# Patient Record
Sex: Female | Born: 2000 | Race: Black or African American | Hispanic: No | Marital: Single | Smoking: Never smoker
Health system: Southern US, Community
[De-identification: ages and names within clinical notes are randomized; demographics above are authoritative.]

## PROBLEM LIST (undated history)

## (undated) DIAGNOSIS — M1990 Unspecified osteoarthritis, unspecified site: Secondary | ICD-10-CM

## (undated) DIAGNOSIS — D560 Alpha thalassemia: Secondary | ICD-10-CM

## (undated) DIAGNOSIS — L732 Hidradenitis suppurativa: Secondary | ICD-10-CM

## (undated) DIAGNOSIS — K5900 Constipation, unspecified: Secondary | ICD-10-CM

## (undated) DIAGNOSIS — R140 Abdominal distension (gaseous): Secondary | ICD-10-CM

## (undated) DIAGNOSIS — R109 Unspecified abdominal pain: Secondary | ICD-10-CM

## (undated) HISTORY — PX: HERNIA REPAIR: SHX51

## (undated) HISTORY — DX: Unspecified abdominal pain: R10.9

## (undated) HISTORY — DX: Abdominal distension (gaseous): R14.0

## (undated) HISTORY — PX: AXILLARY SURGERY: SHX892

## (undated) HISTORY — PX: WISDOM TOOTH EXTRACTION: SHX21

## (undated) HISTORY — DX: Unspecified osteoarthritis, unspecified site: M19.90

## (undated) HISTORY — DX: Constipation, unspecified: K59.00

---
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
## 2018-03-03 ENCOUNTER — Ambulatory Visit (HOSPITAL_COMMUNITY)
Admission: EM | Admit: 2018-03-03 | Discharge: 2018-03-03 | Disposition: A | Discharge status: Home / Self Care | Payer: BC Managed Care – PPO | Attending: Family Medicine | Admitting: Family Medicine

## 2018-03-03 ENCOUNTER — Encounter (HOSPITAL_COMMUNITY): Payer: Self-pay

## 2018-03-03 DIAGNOSIS — L0231 Cutaneous abscess of buttock: Secondary | ICD-10-CM | POA: Diagnosis not present

## 2018-03-03 MED ORDER — CEFDINIR 300 MG PO CAPS: 300.0000 mg | ORAL_CAPSULE | Freq: Two times a day (BID) | ORAL | 0 refills | Status: AC

## 2018-03-03 MED ORDER — MUPIROCIN 2 % EX OINT: 1.0000 "application " | TOPICAL_OINTMENT | Freq: Two times a day (BID) | CUTANEOUS | 0 refills | Status: DC

## 2018-03-03 NOTE — ED Provider Notes (Signed)
[facility]    CSN: [id number] Arrival date & time: 03/03/18  [id number]     History   Chief Complaint Chief Complaint  Patient presents with  . Abscess    right lower cheek area    HPI Rebekah Bennett is a 17 y.o. female.   17 year old female with history of hidradenitis comes in with mother for 6-day history of abscess to the right buttock.  States noticed mild swelling 6 days ago,  and has been applying ointment dermatologist provided, however area with increased swelling, and started hurting 3 days ago.  Mother states has had low-grade fever,  T-max 99.9.  Has been applying warm compress without relief.      Past Medical History:  Diagnosis Date  . Abdominal pain   . Constipation   . Postprandial bloating     Patient Active Problem List   Diagnosis Date Noted  . Microcytic anemia 11/01/2017  . Alpha thalassemia minor trait 11/01/2017  . Hidradenitis suppurativa 05/19/2016  . Intestinal bacterial overgrowth 11/08/2012  . Abdominal bloating 09/16/2012  . Chronic constipation   . Generalized abdominal pain     Past Surgical History:  Procedure Laterality Date  . HERNIA REPAIR      OB History   None      Home Medications    Prior to Admission medications   Medication Sig Start Date End Date Taking? Authorizing Provider  betamethasone dipropionate (DIPROLENE) 0.05 % ointment Apply topically. 03/03/17 03/03/18  [provider]  cefdinir (OMNICEF) 300 MG capsule Take 1 capsule (300 mg total) by mouth 2 (two) times daily for 7 days. 03/03/18 03/10/18  Belinda FisherYu, Harveen Flesch V, PA-C  dicyclomine (BENTYL) 10 MG capsule Take 2 capsules (20 mg total) by mouth 4 (four) times daily -  before meals and at bedtime. 09/23/14 09/25/14  Truddie CocoBush, Tamika, DO  ibuprofen (ADVIL,MOTRIN) 200 MG tablet Take 200 mg by mouth every 6 (six) hours as needed.    [provider]  lansoprazole (PREVACID SOLUTAB) 15 MG disintegrating tablet Take 1 tablet (15 mg total) by mouth 2 (two)  times daily before a meal. 09/23/14 10/23/14  Truddie CocoBush, Tamika, DO  lidocaine (XYLOCAINE) 5 % ointment Apply every 3 hours as needed for pain 12/02/16   [provider]  mupirocin ointment (BACTROBAN) 2 % Apply 1 application topically 2 (two) times daily. 03/03/18   Lygia Olaes V, PA-C  Norethin Ace-Eth Estrad-FE (TAYTULLA PO) Take by mouth.    [provider]  polyethylene glycol powder (GLYCOLAX/MIRALAX) powder Take 17 g by mouth daily.    [provider]  TAYTULLA 1-20 MG-MCG(24) CAPS  10/29/17   [provider]    Family History Family History  Adopted: Yes  Problem Relation Age of Onset  . Healthy Mother     Social History Social History   Tobacco Use  . Smoking status: Never Smoker  . Smokeless tobacco: Never Used  Substance Use Topics  . Alcohol use: Not on file  . Drug use: Not on file     Allergies   Patient has no known allergies.   Review of Systems Review of Systems  Reason unable to perform ROS: See HPI as above.     Physical Exam Triage Vital Signs ED Triage Vitals  Enc Vitals Group     BP 03/03/18 1015 (!) 128/59     Pulse Rate 03/03/18 1015 72     Resp 03/03/18 1015 20     Temp 03/03/18 1015 98.9 F (37.2  C)     Temp Source 03/03/18 1015 Oral     SpO2 03/03/18 1015 100 %     Weight --      Height --      Head Circumference --      Peak Flow --      Pain Score 03/03/18 1013 10     Pain Loc --      Pain Edu? --      Excl. in GC? --    No data found.  Updated Vital Signs BP (!) 128/59 (BP Location: Left Arm)   Pulse 72   Temp 98.9 F (37.2 C) (Oral)   Resp 20   LMP 02/08/2018   SpO2 100%   Physical Exam  Constitutional: She is oriented to person, place, and time. She appears well-developed and well-nourished. No distress.  HENT:  Head: Normocephalic and atraumatic.  Eyes: Pupils are equal, round, and reactive to light. Conjunctivae are normal.  Genitourinary:     Genitourinary Comments: No tenderness to  palpation of the perirectal area.  No tenderness to palpation of the rectum.  Neurological: She is alert and oriented to person, place, and time.  Skin: She is not diaphoretic.     UC Treatments / Results  Labs (all labs ordered are listed, but only abnormal results are displayed) Labs Reviewed - No data to display  EKG None  Radiology No results found.  Procedures Incision and Drainage Date/Time: 03/03/2018 11:29 AM Performed by: Belinda Fisher, PA-C Authorized by: Mardella Layman, MD   Consent:    Consent obtained:  Verbal   Consent given by:  Parent and patient   Risks discussed:  Bleeding, incomplete drainage, infection and pain   Alternatives discussed:  Referral Location:    Type:  Abscess   Size:  2cm x 3cm   Location:  Lower extremity   Lower extremity location:  Buttock   Buttock location:  R buttock Pre-procedure details:    Skin preparation:  Betadine Anesthesia (see MAR for exact dosages):    Anesthesia method:  Local infiltration   Local anesthetic:  Lidocaine 2% WITH epi Procedure type:    Complexity:  Simple Procedure details:    Incision types:  Single straight   Scalpel blade:  11   Wound management:  Probed and deloculated and irrigated with saline   Drainage:  Purulent   Drainage amount:  Copious   Wound treatment:  Wound left open   Packing materials:  None Post-procedure details:    Patient tolerance of procedure:  Tolerated well, no immediate complications   (including critical care time)  Medications Ordered in UC Medications - No data to display  Initial Impression / Assessment and Plan / UC Course  I have reviewed the triage vital signs and the nursing notes.  Pertinent labs & imaging results that were available during my care of the patient were reviewed by me and considered in my medical decision making (see chart for details).    Patient tolerated procedure well.  Wound care instructions discussed.  Will have patient start Cefdinir as  directed.  Return precautions given.  Otherwise follow-up with dermatology for further evaluation and management needed.  Final Clinical Impressions(s) / UC Diagnoses   Final diagnoses:  Abscess of buttock, right    ED Prescriptions    Medication Sig Dispense Auth. Provider   cefdinir (OMNICEF) 300 MG capsule Take 1 capsule (300 mg total) by mouth 2 (two) times daily for 7 days. 14 capsule Cathie Hoops,  Merrilyn Legler V, PA-C   mupirocin ointment (BACTROBAN) 2 % Apply 1 application topically 2 (two) times daily. 22 g Threasa Alpha, [place] 03/03/18 1131

## 2018-03-03 NOTE — Discharge Instructions (Addendum)
Abscess drained.  As discussed, other areas was not big enough for drainage. Start cefdinir as directed. This will treat the surrounding skin infection, and can help prevent the other areas from needing draining.  Continue warm compress.  He can use Bactroban ointment as barrier when redressing the wound.  You can apply gauze to the butt cheeks to help prevent friction to the area.  Please update dermatologist on recent procedure.  Follow-up with dermatologist for further evaluation needed.  If experiencing worsening symptoms, worsening swelling, redness, increased warmth follow-up for reevaluation needed.

## 2018-03-03 NOTE — ED Triage Notes (Signed)
Pt presents with abcess on right lower cheek area.

## 2018-04-07 ENCOUNTER — Ambulatory Visit (HOSPITAL_COMMUNITY)
Admission: EM | Admit: 2018-04-07 | Discharge: 2018-04-07 | Disposition: A | Discharge status: Home / Self Care | Payer: BC Managed Care – PPO | Attending: Family Medicine | Admitting: Family Medicine

## 2018-04-07 ENCOUNTER — Encounter (HOSPITAL_COMMUNITY): Payer: Self-pay

## 2018-04-07 ENCOUNTER — Other Ambulatory Visit: Payer: Self-pay

## 2018-04-07 DIAGNOSIS — J039 Acute tonsillitis, unspecified: Secondary | ICD-10-CM | POA: Insufficient documentation

## 2018-04-07 DIAGNOSIS — J029 Acute pharyngitis, unspecified: Secondary | ICD-10-CM | POA: Diagnosis present

## 2018-04-07 DIAGNOSIS — Z7984 Long term (current) use of oral hypoglycemic drugs: Secondary | ICD-10-CM | POA: Insufficient documentation

## 2018-04-07 DIAGNOSIS — R499 Unspecified voice and resonance disorder: Secondary | ICD-10-CM | POA: Diagnosis not present

## 2018-04-07 DIAGNOSIS — Z79899 Other long term (current) drug therapy: Secondary | ICD-10-CM | POA: Insufficient documentation

## 2018-04-07 LAB — POCT RAPID STREP A: Streptococcus, Group A Screen (Direct): NEGATIVE

## 2018-04-07 MED ORDER — AMOXICILLIN-POT CLAVULANATE 875-125 MG PO TABS: 1.0000 | ORAL_TABLET | Freq: Two times a day (BID) | ORAL | 0 refills | Status: DC

## 2018-04-07 MED ORDER — LIDOCAINE VISCOUS HCL 2 % MT SOLN: 15.0000 mL | OROMUCOSAL | 0 refills | Status: DC | PRN

## 2018-04-07 NOTE — Discharge Instructions (Addendum)
Rest and push fluids Strep was negative Concern for early onset peritonsillar abscess.  Augmentin prescribed.  Take as directed and to completion Continue to alternate ibuprofen and tylenol as needed for symptomatic relief Return or go to the ED if you have any new or worsening symptoms including including increased pain, fever, difficulty swallowing liquids or saliva, trouble breathing, chest pain, vomiting, abdominal pain, etc..

## 2018-04-07 NOTE — ED Provider Notes (Signed)
[facility]   [id number] 04/07/18 Arrival Time: 0800   CC: Sore throat   SUBJECTIVE: History from: patient.  Rebekah Bennett is a 17 y.o. female who presents with abrupt onset of sore throat that started last night.  Denies positive sick exposure or precipitating event.  Has tried tyenol without relief.  Symptoms are made worse with swallowing, but is tolerating liquids and own secretions without difficulty.  Denies previous symptoms in the past.   Reports subjective fever, chills, and muffled voice.  Denies fatigue, sinus pain, rhinorrhea, cough, SOB, wheezing, drooling, chest pain, nausea, rash, changes in bowel or bladder habits.    ROS: As per HPI.  Past Medical History:  Diagnosis Date  . Abdominal pain   . Constipation   . Postprandial bloating    Past Surgical History:  Procedure Laterality Date  . HERNIA REPAIR     No Known Allergies No current facility-administered medications on file prior to encounter.    Current Outpatient Medications on File Prior to Encounter  Medication Sig Dispense Refill  . metFORMIN (GLUCOPHAGE) 500 MG tablet Take by mouth 2 (two) times daily with a meal.    . ibuprofen (ADVIL,MOTRIN) 200 MG tablet Take 200 mg by mouth every 6 (six) hours as needed.    . Norethin Ace-Eth Estrad-FE (Rebekah PO) Take by mouth.    . polyethylene glycol powder (GLYCOLAX/MIRALAX) powder Take 17 g by mouth daily.    Rebekah Bennett. Rebekah Bennett      Social History   Socioeconomic History  . Marital status: Single    Spouse name: Not on file  . Number of children: Not on file  . Years of education: Not on file  . Highest education level: Not on file  Occupational History  . Not on file  Social Needs  . Financial resource strain: Not on file  . Food insecurity:    Worry: Not on file    Inability: Not on file  . Transportation needs:    Medical: Not on file    Non-medical: Not on file  Tobacco Use  . Smoking status: Never Smoker  .  Smokeless tobacco: Never Used  Substance and Sexual Activity  . Alcohol use: Not on file  . Drug use: Not on file  . Sexual activity: Not on file  Lifestyle  . Physical activity:    Days per week: Not on file    Minutes per session: Not on file  . Stress: Not on file  Relationships  . Social connections:    Talks on phone: Not on file    Gets together: Not on file    Attends religious service: Not on file    Active member of club or organization: Not on file    Attends meetings of clubs or organizations: Not on file    Relationship status: Not on file  . Intimate partner violence:    Fear of current or ex partner: Not on file    Emotionally abused: Not on file    Physically abused: Not on file    Forced sexual activity: Not on file  Other Topics Concern  . Not on file  Social History Narrative   8th grade   Family History  Adopted: Yes  Problem Relation Age of Onset  . Healthy Mother     OBJECTIVE:  Vitals:   04/07/18 0811 04/07/18 0815  BP: 132/76   Pulse: 91   Resp: 18   Temp: 99.1 F (37.3 C)  TempSrc: Oral   SpO2: 98%   Weight:  259 lb (117.5 kg)     General appearance: alert; appears fatigued; nontoxic appearance HEENT: Ears: EACs clear, TMs pearly gray with visible cone of light, without erythema; Eyes: PERRL, EOMI grossly; Sinuses nontender to palpation; Nose: clear rhinorrhea; Throat: oropharynx mildly erythematous, tonsils 3+ with white tonsillar exudates, uvula midline, no obvious soft palate swelling or erythema Neck: right sided submandibular and anterior cervical chain LAD Lungs: CTA bilaterally without adventitious breath sounds Heart: regular rate and rhythm.  Radial pulses 2+ symmetrical bilaterally Skin: warm and dry Psychological: alert and cooperative; normal mood and affect  Labs: Results for orders placed or performed during the hospital encounter of 04/07/18 (from the past 24 hour(s))  POCT rapid strep A [facility]     Status:  None   Collection Time: 04/07/18  8:45 AM  Result Value Ref Range   Streptococcus, Group A Screen (Direct) NEGATIVE NEGATIVE     ASSESSMENT & PLAN:  1. Acute tonsillitis, unspecified etiology   2. Change in voice   3. Sore throat     Meds ordered this encounter  Medications  . amoxicillin-clavulanate (AUGMENTIN) 875-125 MG tablet    Sig: Take 1 tablet by mouth every 12 (twelve) hours.    Dispense:  10 tablet    Refill:  0    Order Specific Question:   Supervising Provider    Answer:   Rebekah Bennett [phone][[id number]]  . lidocaine (XYLOCAINE) 2 % solution    Sig: Use as directed 15 mLs in the mouth or throat as needed for mouth pain (do not exceed 8 doses in a 24 hour period).    Dispense:  100 mL    Refill:  0    Order Specific Question:   Supervising Provider    Answer:   Rebekah Bennett [[id number]]    Rest and push fluids Strep was negative Concern for early onset peritonsillar abscess.  Augmentin prescribed.  Take as directed and to completion Continue to alternate ibuprofen and tylenol as needed for symptomatic relief Viscous lidocaine also prescribed Return or go to the ED if you have any new or worsening symptoms including including increased pain, fever, difficulty swallowing liquids or saliva, trouble breathing, chest pain, vomiting, abdominal pain, etc..   Reviewed expectations re: course of current medical issues. Questions answered. Outlined signs and symptoms indicating need for more acute intervention. Patient verbalized understanding. After Visit Summary given.        Rebekah HardingWurst, Shafter Jupin, PA-C 04/07/18 1021

## 2018-04-07 NOTE — ED Triage Notes (Signed)
Pt c/o right side jaw pain and sore throat. This started last night.

## 2018-04-09 LAB — CULTURE, GROUP A STREP (THRC)

## 2018-06-04 DIAGNOSIS — J351 Hypertrophy of tonsils: Secondary | ICD-10-CM | POA: Insufficient documentation

## 2018-06-04 DIAGNOSIS — J353 Hypertrophy of tonsils with hypertrophy of adenoids: Secondary | ICD-10-CM | POA: Insufficient documentation

## 2018-06-04 HISTORY — DX: Hypertrophy of tonsils: J35.1

## 2018-07-31 ENCOUNTER — Ambulatory Visit (HOSPITAL_COMMUNITY)
Admission: EM | Admit: 2018-07-31 | Discharge: 2018-07-31 | Disposition: A | Discharge status: Home / Self Care | Payer: BC Managed Care – PPO | Attending: Physician Assistant | Admitting: Physician Assistant

## 2018-07-31 ENCOUNTER — Other Ambulatory Visit: Payer: Self-pay

## 2018-07-31 ENCOUNTER — Encounter (HOSPITAL_COMMUNITY): Payer: Self-pay

## 2018-07-31 DIAGNOSIS — L0501 Pilonidal cyst with abscess: Secondary | ICD-10-CM | POA: Diagnosis not present

## 2018-07-31 MED ORDER — HYDROCODONE-ACETAMINOPHEN 5-325 MG PO TABS: 1.0000 | ORAL_TABLET | Freq: Four times a day (QID) | ORAL | 0 refills | Status: DC | PRN

## 2018-07-31 MED ORDER — LIDOCAINE-EPINEPHRINE (PF) 2 %-1:200000 IJ SOLN
INTRAMUSCULAR | Status: AC
  Filled 2018-07-31: qty 10

## 2018-07-31 NOTE — ED Triage Notes (Signed)
Pt states she has an abscess on her buttock x 2 days.

## 2018-07-31 NOTE — Discharge Instructions (Signed)
Continue Cefdinir. You can remove current dressing in 24 hours, and redress wound, do not remove packing. Keep wound clean and dry. Do not soak area in water. Follow up in 2 days for packing removal and wound recheck.  Take ibuprofen 800mg  three times a day for the pain, and supplement with Norco for additional pain relief.

## 2018-07-31 NOTE — ED Provider Notes (Signed)
[facility]    CSN: [id number] Arrival date & time: 07/31/18  1001     History   Chief Complaint Chief Complaint  Patient presents with  . Abscess    HPI Rebekah Bennett is a 17 y.o. female.   17 year old female with history of hidradenitis comes in with mother for 2-day history of abscess to the pilonidal area.  Patient states area has been hurting for a week, but noticed abscess 2 days ago.  Has been doing warm compress.  Patient started taking cefdinir 2 days ago as directed by dermatologist when having HS flare. Denies fever, chills, night sweats.      Past Medical History:  Diagnosis Date  . Abdominal pain   . Constipation   . Postprandial bloating     Patient Active Problem List   Diagnosis Date Noted  . Microcytic anemia 11/01/2017  . Alpha thalassemia minor trait 11/01/2017  . Hidradenitis suppurativa 05/19/2016  . Intestinal bacterial overgrowth 11/08/2012  . Abdominal bloating 09/16/2012  . Chronic constipation   . Generalized abdominal pain     Past Surgical History:  Procedure Laterality Date  . HERNIA REPAIR      OB History   None      Home Medications    Prior to Admission medications   Medication Sig Start Date End Date Taking? Authorizing Provider  HYDROcodone-acetaminophen (NORCO/VICODIN) 5-325 MG tablet Take 1 tablet by mouth every 6 (six) hours as needed for severe pain. 07/31/18   Cathie Hoops, Amy V, PA-C  ibuprofen (ADVIL,MOTRIN) 200 MG tablet Take 200 mg by mouth every 6 (six) hours as needed.    [provider]  metFORMIN (GLUCOPHAGE) 500 MG tablet Take by mouth 2 (two) times daily with a meal.    [provider]  Norethin Ace-Eth Estrad-FE (TAYTULLA PO) Take by mouth.    [provider]  polyethylene glycol powder (GLYCOLAX/MIRALAX) powder Take 17 g by mouth daily.    [provider]  TAYTULLA 1-20 MG-MCG(24) CAPS  10/29/17   [provider]    Family History Family History  Adopted:  Yes  Problem Relation Age of Onset  . Healthy Mother     Social History Social History   Tobacco Use  . Smoking status: Never Smoker  . Smokeless tobacco: Never Used  Substance Use Topics  . Alcohol use: Not on file  . Drug use: Not on file     Allergies   Patient has no known allergies.   Review of Systems Review of Systems  Reason unable to perform ROS: See HPI as above.     Physical Exam Triage Vital Signs ED Triage Vitals  Enc Vitals Group     BP --      Pulse Rate 07/31/18 1023 98     Resp 07/31/18 1023 18     Temp 07/31/18 1023 99.2 F (37.3 C)     Temp Source 07/31/18 1023 Oral     SpO2 07/31/18 1023 100 %     Weight 07/31/18 1024 265 lb (120.2 kg)     Height --      Head Circumference --      Peak Flow --      Pain Score 07/31/18 1023 8     Pain Loc --      Pain Edu? --      Excl. in GC? --    No data found.  Updated Vital Signs Pulse 98   Temp 99.2 F (37.3 C) (Oral)  Resp 18   Wt 265 lb (120.2 kg)   LMP 07/01/2018   SpO2 100%   BMI 40.89 kg/m   Physical Exam  Constitutional: She is oriented to person, place, and time. She appears well-developed and well-nourished. No distress.  HENT:  Head: Normocephalic and atraumatic.  Eyes: Pupils are equal, round, and reactive to light. Conjunctivae are normal.  Genitourinary:  Genitourinary Comments: 1 cm x 4 cm pilonidal abscess with surrounding erythema and warmth.  No tenderness to palpation to the rectal area.  Neurological: She is alert and oriented to person, place, and time.  Skin: She is not diaphoretic.     UC Treatments / Results  Labs (all labs ordered are listed, but only abnormal results are displayed) Labs Reviewed - No data to display  EKG None  Radiology No results found.  Procedures Incision and Drainage Date/Time: 07/31/2018 3:19 PM Performed by: Belinda FisherYu, Amy V, PA-C Authorized by: Belinda FisherYu, Amy V, PA-C   Consent:    Consent obtained:  Verbal   Consent given by:   Patient   Risks discussed:  Incomplete drainage, pain, infection, bleeding and damage to other organs   Alternatives discussed:  Alternative treatment and referral Location:    Type:  Abscess   Size:  1cm x 4cm   Location:  Anogenital   Anogenital location:  Pilonidal Pre-procedure details:    Skin preparation:  Hibiclens Anesthesia (see MAR for exact dosages):    Anesthesia method:  Local infiltration   Local anesthetic:  Lidocaine 2% WITH epi Procedure type:    Complexity:  Simple Procedure details:    Needle aspiration: no     Incision types:  Single straight   Incision depth:  Dermal   Scalpel blade:  11   Wound management:  Probed and deloculated   Drainage:  Bloody and purulent   Drainage amount:  Copious   Wound treatment:  Wound left open   Packing materials:  1/2 in iodoform gauze Post-procedure details:    Patient tolerance of procedure:  Tolerated well, no immediate complications   (including critical care time)  Medications Ordered in UC Medications - No data to display  Initial Impression / Assessment and Plan / UC Course  I have reviewed the triage vital signs and the nursing notes.  Pertinent labs & imaging results that were available during my care of the patient were reviewed by me and considered in my medical decision making (see chart for details).    Patient tolerated procedure well.  Patient to continue cefdinir for now. Wound care instructions given. Return precautions given.  Otherwise, follow-up in 2 days for packing removal and wound recheck.  Final Clinical Impressions(s) / UC Diagnoses   Final diagnoses:  Pilonidal abscess    ED Prescriptions    Medication Sig Dispense Auth. Provider   HYDROcodone-acetaminophen (NORCO/VICODIN) 5-325 MG tablet Take 1 tablet by mouth every 6 (six) hours as needed for severe pain. 10 tablet Belinda FisherYu, Amy V, PA-C     Controlled Substance Prescriptions [place] Controlled Substance Registry consulted? Yes, I have  consulted the [place] Controlled Substances Registry for this patient, and feel the risk/benefit ratio today is favorable for proceeding with this prescription for a controlled substance.   Belinda FisherYu, Amy V, PA-C 07/31/18 1520

## 2018-08-02 ENCOUNTER — Other Ambulatory Visit: Payer: Self-pay

## 2018-08-02 ENCOUNTER — Encounter (HOSPITAL_COMMUNITY): Payer: Self-pay | Admitting: Emergency Medicine

## 2018-08-02 ENCOUNTER — Ambulatory Visit (HOSPITAL_COMMUNITY)
Admission: EM | Admit: 2018-08-02 | Discharge: 2018-08-02 | Disposition: A | Discharge status: Home / Self Care | Payer: BC Managed Care – PPO | Attending: Family Medicine | Admitting: Family Medicine

## 2018-08-02 DIAGNOSIS — Z5189 Encounter for other specified aftercare: Secondary | ICD-10-CM

## 2018-08-02 MED ORDER — AMOXICILLIN-POT CLAVULANATE 875-125 MG PO TABS: 1.0000 | ORAL_TABLET | Freq: Two times a day (BID) | ORAL | 0 refills | Status: AC

## 2018-08-02 NOTE — ED Provider Notes (Signed)
[facility]    CSN: [id number] Arrival date & time: 08/02/18  0932     History   Chief Complaint Chief Complaint  Patient presents with  . Appointment    9:30 am  . Follow-up    HPI Rebekah Bennett is a 17 y.o. female history of hidradenitis suppurativa presenting today for abscess follow-up.  Patient was seen here 2 days ago and had pilonidal abscess drained.  She is here to have packing removed and follow-up.  She has had slight improvement in her pain.  Has had continued drainage.  She sees a specialist in [place] for her recurrent issues.  Over the past few months her symptoms have worsened.  She has been taking Omnicef and is wondering if she needs to be on a different antibiotic as she has been on this for a while.  HPI  Past Medical History:  Diagnosis Date  . Abdominal pain   . Constipation   . Postprandial bloating     Patient Active Problem List   Diagnosis Date Noted  . Microcytic anemia 11/01/2017  . Alpha thalassemia minor trait 11/01/2017  . Hidradenitis suppurativa 05/19/2016  . Intestinal bacterial overgrowth 11/08/2012  . Abdominal bloating 09/16/2012  . Chronic constipation   . Generalized abdominal pain     Past Surgical History:  Procedure Laterality Date  . HERNIA REPAIR      OB History   None      Home Medications    Prior to Admission medications   Medication Sig Start Date End Date Taking? Authorizing Provider  cefdinir (OMNICEF) 300 MG capsule Take 300 mg by mouth 2 (two) times daily.   Yes [provider]  amoxicillin-clavulanate (AUGMENTIN) 875-125 MG tablet Take 1 tablet by mouth every 12 (twelve) hours for 7 days. 08/02/18 08/09/18  Fernande Treiber C, PA-C  HYDROcodone-acetaminophen (NORCO/VICODIN) 5-325 MG tablet Take 1 tablet by mouth every 6 (six) hours as needed for severe pain. 07/31/18   Cathie Hoops, Amy V, PA-C  ibuprofen (ADVIL,MOTRIN) 200 MG tablet Take 200 mg by mouth every 6 (six) hours as needed.     [provider]  Norethin Ace-Eth Estrad-FE (TAYTULLA PO) Take by mouth.    [provider]  polyethylene glycol powder (GLYCOLAX/MIRALAX) powder Take 17 g by mouth daily.    [provider]  TAYTULLA 1-20 MG-MCG(24) CAPS  10/29/17   [provider]    Family History Family History  Adopted: Yes  Problem Relation Age of Onset  . Healthy Mother     Social History Social History   Tobacco Use  . Smoking status: Never Smoker  . Smokeless tobacco: Never Used  Substance Use Topics  . Alcohol use: Not on file  . Drug use: Not on file     Allergies   Patient has no known allergies.   Review of Systems Review of Systems  Constitutional: Negative for fatigue and fever.  HENT: Negative for mouth sores.   Eyes: Negative for visual disturbance.  Respiratory: Negative for shortness of breath.   Cardiovascular: Negative for chest pain.  Gastrointestinal: Negative for abdominal pain, nausea and vomiting.  Musculoskeletal: Negative for arthralgias and joint swelling.  Skin: Positive for color change and wound. Negative for rash.  Neurological: Negative for dizziness, weakness, light-headedness and headaches.     Physical Exam Triage Vital Signs ED Triage Vitals  Enc Vitals Group     BP 08/02/18 0941 126/84     Pulse Rate 08/02/18 0941 68  Resp 08/02/18 0941 18     Temp 08/02/18 0941 99.2 F (37.3 C)     Temp Source 08/02/18 0941 Oral     SpO2 08/02/18 0941 97 %     Weight --      Height --      Head Circumference --      Peak Flow --      Pain Score 08/02/18 0938 5     Pain Loc --      Pain Edu? --      Excl. in GC? --    No data found.  Updated Vital Signs BP 126/84 (BP Location: Left Arm) Comment (BP Location): large cuff  Pulse 68   Temp 99.2 F (37.3 C) (Oral)   Resp 18   SpO2 97%   Visual Acuity Right Eye Distance:   Left Eye Distance:   Bilateral Distance:    Right Eye Near:   Left Eye Near:    Bilateral  Near:     Physical Exam  Constitutional: She is oriented to person, place, and time. She appears well-developed and well-nourished.  No acute distress  HENT:  Head: Normocephalic and atraumatic.  Nose: Nose normal.  Eyes: Conjunctivae are normal.  Neck: Neck supple.  Cardiovascular: Normal rate.  Pulmonary/Chest: Effort normal. No respiratory distress.  Abdominal: She exhibits no distension.  Musculoskeletal: Normal range of motion.  Neurological: She is alert and oriented to person, place, and time.  Skin: Skin is warm and dry.  1 to 2 cm open draining to left gluteal cleft/pilonidal area, purulent drainage still present and actively draining, packing removed, mild surrounding induration  Psychiatric: She has a normal mood and affect.  Nursing note and vitals reviewed.    UC Treatments / Results  Labs (all labs ordered are listed, but only abnormal results are displayed) Labs Reviewed - No data to display  EKG None  Radiology No results found.  Procedures Procedures (including critical care time)  Medications Ordered in UC Medications - No data to display  Initial Impression / Assessment and Plan / UC Course  I have reviewed the triage vital signs and the nursing notes.  Pertinent labs & imaging results that were available during my care of the patient were reviewed by me and considered in my medical decision making (see chart for details).    Packing removed from abscess, [facility]  Will have patient follow-up with specialist.Discussed strict return precautions. Patient verbalized understanding and is agreeable with plan.  Final Clinical Impressions(s) / UC Diagnoses   Final diagnoses:  Wound check, abscess     Discharge Instructions     Packing was removed Switch to Augmentin twice daily  Continue warm compresses/saoks, dressing changes   ED Prescriptions    Medication Sig Dispense Auth. Provider    amoxicillin-clavulanate (AUGMENTIN) 875-125 MG tablet Take 1 tablet by mouth every 12 (twelve) hours for 7 days. 14 tablet Yoandri Congrove, [place]ie C, PA-C     Controlled Substance Prescriptions [place] Controlled Substance Registry consulted? Not Applicable   Lew DawesWieters, Waverly Tarquinio C, [place] JerseyPA-C 08/02/18 1008

## 2018-08-02 NOTE — Discharge Instructions (Signed)
Packing was removed Switch to Augmentin twice daily  Continue warm compresses/saoks, dressing changes

## 2018-08-02 NOTE — ED Triage Notes (Signed)
Patient seen 12/7 for abscess.  Patient is here today for follow up.  Still having small amount of discharge.  Pain is somewhat better than when seen 12/7

## 2018-10-22 ENCOUNTER — Telehealth: Payer: Self-pay | Admitting: Internal Medicine

## 2018-10-22 NOTE — Telephone Encounter (Signed)
Former perlov patient. Called to confirm 3/6 lab/fu. Per patient cancel due to work schedule, will call back to reschedule.

## 2018-10-29 ENCOUNTER — Other Ambulatory Visit: Payer: Self-pay

## 2018-10-29 ENCOUNTER — Ambulatory Visit: Payer: Self-pay | Admitting: Internal Medicine

## 2019-01-03 ENCOUNTER — Ambulatory Visit (HOSPITAL_COMMUNITY)
Admission: EM | Admit: 2019-01-03 | Discharge: 2019-01-03 | Disposition: A | Discharge status: Home / Self Care | Payer: BC Managed Care – PPO | Attending: Family Medicine | Admitting: Family Medicine

## 2019-01-03 ENCOUNTER — Ambulatory Visit (INDEPENDENT_AMBULATORY_CARE_PROVIDER_SITE_OTHER): Payer: BC Managed Care – PPO

## 2019-01-03 ENCOUNTER — Other Ambulatory Visit: Payer: Self-pay

## 2019-01-03 ENCOUNTER — Encounter (HOSPITAL_COMMUNITY): Payer: Self-pay | Admitting: *Deleted

## 2019-01-03 DIAGNOSIS — M25572 Pain in left ankle and joints of left foot: Secondary | ICD-10-CM | POA: Diagnosis not present

## 2019-01-03 DIAGNOSIS — M25562 Pain in left knee: Secondary | ICD-10-CM

## 2019-01-03 HISTORY — DX: Hidradenitis suppurativa: L73.2

## 2019-01-03 MED ORDER — CYCLOBENZAPRINE HCL 5 MG PO TABS: 5.0000 mg | ORAL_TABLET | Freq: Two times a day (BID) | ORAL | 0 refills | Status: DC | PRN

## 2019-01-03 MED ORDER — IBUPROFEN 800 MG PO TABS: 800.0000 mg | ORAL_TABLET | Freq: Three times a day (TID) | ORAL | 0 refills | Status: DC
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
## 2019-01-03 NOTE — Discharge Instructions (Signed)
Xrays normal  Use anti-inflammatories for pain/swelling. You may take up to 800 mg Ibuprofen every 8 hours with food. You may supplement Ibuprofen with Tylenol [phone] mg every 8 hours.   You may use flexeril as needed to help with pain. This is a muscle relaxer and causes sedation- please use only at bedtime or when you will be home and not have to drive/work  Ice and heat  Follow up if not improving in 1-2 weeks, developing numbness, tingling, weakness, issues with urination/bowel movements.
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
## 2019-01-06 ENCOUNTER — Encounter (HOSPITAL_COMMUNITY): Payer: Self-pay

## 2019-01-06 ENCOUNTER — Other Ambulatory Visit: Payer: Self-pay

## 2019-01-06 ENCOUNTER — Ambulatory Visit (HOSPITAL_COMMUNITY)
Admission: EM | Admit: 2019-01-06 | Discharge: 2019-01-06 | Disposition: A | Discharge status: Home / Self Care | Payer: BC Managed Care – PPO | Attending: Family Medicine | Admitting: Family Medicine

## 2019-01-06 DIAGNOSIS — M79605 Pain in left leg: Secondary | ICD-10-CM

## 2019-01-06 MED ORDER — TRAMADOL HCL 50 MG PO TABS: 50.0000 mg | ORAL_TABLET | Freq: Four times a day (QID) | ORAL | 0 refills | Status: DC | PRN

## 2019-01-06 NOTE — Discharge Instructions (Signed)
Be aware, pain medications may cause drowsiness. Please do not drive, operate heavy machinery or make important decisions while on this medication, it can cloud your judgement.  
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
## 2019-01-20 ENCOUNTER — Other Ambulatory Visit: Payer: Self-pay | Admitting: Orthopedic Surgery

## 2019-01-20 DIAGNOSIS — M545 Low back pain, unspecified: Secondary | ICD-10-CM

## 2019-02-15 ENCOUNTER — Other Ambulatory Visit: Payer: BC Managed Care – PPO

## 2020-07-04 ENCOUNTER — Encounter (HOSPITAL_COMMUNITY): Payer: Self-pay

## 2020-07-04 ENCOUNTER — Other Ambulatory Visit: Payer: Self-pay

## 2020-07-04 ENCOUNTER — Ambulatory Visit (HOSPITAL_COMMUNITY)
Admission: EM | Admit: 2020-07-04 | Discharge: 2020-07-04 | Disposition: A | Discharge status: Home / Self Care | Payer: BC Managed Care – PPO | Attending: Family Medicine | Admitting: Family Medicine

## 2020-07-04 DIAGNOSIS — L732 Hidradenitis suppurativa: Secondary | ICD-10-CM

## 2020-07-04 DIAGNOSIS — L0501 Pilonidal cyst with abscess: Secondary | ICD-10-CM | POA: Diagnosis not present

## 2020-07-04 DIAGNOSIS — R000 Tachycardia, unspecified: Secondary | ICD-10-CM

## 2020-07-04 MED ORDER — LIDOCAINE HCL (PF) 1 % IJ SOLN
INTRAMUSCULAR | Status: AC
  Filled 2020-07-04: qty 2

## 2020-07-04 MED ORDER — HYDROCODONE-ACETAMINOPHEN 5-325 MG PO TABS
1.0000 | ORAL_TABLET | Freq: Once | ORAL | Status: AC
  Administered 2020-07-04: 1 via ORAL

## 2020-07-04 MED ORDER — SULFAMETHOXAZOLE-TRIMETHOPRIM 800-160 MG PO TABS: 1.0000 | ORAL_TABLET | Freq: Two times a day (BID) | ORAL | 0 refills | Status: AC

## 2020-07-04 MED ORDER — HIBICLENS 4 % EX LIQD: Freq: Every day | CUTANEOUS | 0 refills | Status: DC | PRN

## 2020-07-04 MED ORDER — LIDOCAINE HCL (PF) 1 % IJ SOLN
INTRAMUSCULAR | Status: AC
  Filled 2020-07-04: qty 30

## 2020-07-04 MED ORDER — HYDROCODONE-ACETAMINOPHEN 5-325 MG PO TABS
ORAL_TABLET | ORAL | Status: AC
  Filled 2020-07-04: qty 1

## 2020-07-04 NOTE — Discharge Instructions (Addendum)
[facility] [phone] [address], [place], [place] [address]

## 2020-07-04 NOTE — ED Triage Notes (Addendum)
Pt present a boil located in the between her buttocks. Pt states it hurts to sit down and would the boil is draining a little bit. Pt states she has a History of Hydradenitis and had a infusion doing this am. She has this procedure every 8 wks.

## 2020-07-05 ENCOUNTER — Telehealth (HOSPITAL_COMMUNITY): Payer: Self-pay | Admitting: Family Medicine

## 2020-07-05 MED ORDER — HYDROCODONE-ACETAMINOPHEN 5-325 MG PO TABS: 1.0000 | ORAL_TABLET | Freq: Three times a day (TID) | ORAL | 0 refills | Status: DC | PRN

## 2020-07-05 NOTE — Telephone Encounter (Signed)
Pain medicine related to yesterday's visit sent

## 2020-07-07 ENCOUNTER — Encounter (HOSPITAL_COMMUNITY): Payer: Self-pay

## 2020-07-07 ENCOUNTER — Other Ambulatory Visit: Payer: Self-pay

## 2020-07-07 ENCOUNTER — Ambulatory Visit (HOSPITAL_COMMUNITY)
Admission: RE | Admit: 2020-07-07 | Discharge: 2020-07-07 | Disposition: A | Discharge status: Home / Self Care | Payer: BC Managed Care – PPO | Source: Ambulatory Visit | Attending: Emergency Medicine | Admitting: Emergency Medicine

## 2020-07-07 VITALS — BP 129/74 | HR 70 | Temp 98.1°F | Resp 17

## 2020-07-07 DIAGNOSIS — Z5189 Encounter for other specified aftercare: Secondary | ICD-10-CM

## 2020-07-07 MED ORDER — IBUPROFEN 800 MG PO TABS: 800.0000 mg | ORAL_TABLET | Freq: Three times a day (TID) | ORAL | 0 refills | Status: DC

## 2020-07-07 NOTE — ED Triage Notes (Signed)
Pt presents for wound check and packing checking from abscess that was drained X 3 days ago.

## 2020-07-07 NOTE — ED Provider Notes (Signed)
[facility]    CSN: [id number] Arrival date & time: 07/07/20  1247      History   Chief Complaint Chief Complaint  Patient presents with  . Appointment  . Wound Check    HPI Rebekah Bennett is a 19 y.o. female presenting today for follow-up of abscess.  Had I&D performed of pilonidal abscess approximately 3 days ago.  Returning here today for packing removal and recheck.  Overall her pain has began to improve and feels she is able to move around and bend slightly more easily than she previously was.  Reports history of hidradenitis suppurativa, on Remicade.  Has also been taking Bactrim prescribed at prior visit.  HPI  Past Medical History:  Diagnosis Date  . Abdominal pain   . Constipation   . Hydradenitis   . Postprandial bloating     Patient Active Problem List   Diagnosis Date Noted  . Microcytic anemia 11/01/2017  . Alpha thalassemia minor trait 11/01/2017  . Hidradenitis suppurativa 05/19/2016  . Intestinal bacterial overgrowth 11/08/2012  . Abdominal bloating 09/16/2012  . Chronic constipation   . Generalized abdominal pain     Past Surgical History:  Procedure Laterality Date  . HERNIA REPAIR      OB History   No obstetric history on file.      Home Medications    Prior to Admission medications   Medication Sig Start Date End Date Taking? Authorizing Provider  chlorhexidine (HIBICLENS) 4 % external liquid Apply topically daily as needed. 07/04/20   Particia Nearing, PA-C  cyclobenzaprine (FLEXERIL) 5 MG tablet Take 1-2 tablets (5-10 mg total) by mouth 2 (two) times daily as needed for muscle spasms. 01/03/19   Rodrecus Belsky C, PA-C  HYDROcodone-acetaminophen (NORCO/VICODIN) 5-325 MG tablet Take 1 tablet by mouth 3 (three) times daily as needed for moderate pain. 07/05/20   Particia Nearing, PA-C  ibuprofen (ADVIL) 800 MG tablet Take 1 tablet (800 mg total) by mouth 3 (three) times daily. 07/07/20   Afrika Brick C, PA-C    inFLIXimab (REMICADE IV) Inject into the vein.    [provider]  Norethin Ace-Eth Estrad-FE (TAYTULLA PO) Take by mouth.    [provider]  sulfamethoxazole-trimethoprim (BACTRIM DS) 800-160 MG tablet Take 1 tablet by mouth 2 (two) times daily for 7 days. 07/04/20 07/11/20  Particia Nearing, PA-C  traMADol (ULTRAM) 50 MG tablet Take 1 tablet (50 mg total) by mouth every 6 (six) hours as needed. 01/06/19   Mardella Layman, MD    Family History Family History  Adopted: Yes  Problem Relation Age of Onset  . Healthy Mother     Social History Social History   Tobacco Use  . Smoking status: Never Smoker  . Smokeless tobacco: Never Used  Vaping Use  . Vaping Use: Never used  Substance Use Topics  . Alcohol use: Not Currently  . Drug use: Not Currently     Allergies   Patient has no known allergies.   Review of Systems Review of Systems  Constitutional: Negative for fatigue and fever.  Eyes: Negative for visual disturbance.  Respiratory: Negative for shortness of breath.   Cardiovascular: Negative for chest pain.  Gastrointestinal: Negative for abdominal pain, nausea and vomiting.  Musculoskeletal: Negative for arthralgias and joint swelling.  Skin: Positive for color change and wound. Negative for rash.  Neurological: Negative for dizziness, weakness, light-headedness and headaches.     Physical Exam Triage Vital Signs ED Triage Vitals  Enc Vitals Group     BP      Pulse      Resp      Temp      Temp src      SpO2      Weight      Height      Head Circumference      Peak Flow      Pain Score      Pain Loc      Pain Edu?      Excl. in GC?    No data found.  Updated Vital Signs BP 129/74 (BP Location: Right Arm)   Pulse 70   Temp 98.1 F (36.7 C) (Oral)   Resp 17   LMP 07/01/2020   SpO2 99%   Visual Acuity Right Eye Distance:   Left Eye Distance:   Bilateral Distance:    Right Eye Near:   Left Eye Near:    Bilateral  Near:     Physical Exam Vitals and nursing note reviewed.  Constitutional:      Appearance: She is well-developed.     Comments: No acute distress  HENT:     Head: Normocephalic and atraumatic.     Nose: Nose normal.  Eyes:     Conjunctiva/sclera: Conjunctivae normal.  Cardiovascular:     Rate and Rhythm: Normal rate.  Pulmonary:     Effort: Pulmonary effort is normal. No respiratory distress.  Abdominal:     General: There is no distension.  Musculoskeletal:        General: Normal range of motion.     Cervical back: Neck supple.  Skin:    General: Skin is warm and dry.     Comments: Packing removed from pilonidal abscess, open wound, continued purulent drainage present, minimal surrounding induration  Neurological:     Mental Status: She is alert and oriented to person, place, and time.      UC Treatments / Results  Labs (all labs ordered are listed, but only abnormal results are displayed) Labs Reviewed - No data to display  EKG   Radiology No results found.  Procedures Procedures (including critical care time)  Medications Ordered in UC Medications - No data to display  Initial Impression / Assessment and Plan / UC Course  I have reviewed the triage vital signs and the nursing notes.  Pertinent labs & imaging results that were available during my care of the patient were reviewed by me and considered in my medical decision making (see chart for details).    Abscess Resolving- Packing removed, continue Bactrim, warm compresses and anti-inflammatories for pain.  Monitor for continued gradual resolution of pain and symptoms, discussed wound care and dressing changes.  Provided contact for [facility] to follow-up given history of recurrent abscesses.  Discussed strict return precautions. Patient verbalized understanding and is agreeable with plan.  Final Clinical Impressions(s) / UC Diagnoses   Final diagnoses:  Wound check, abscess     Discharge  Instructions     Use anti-inflammatories for pain/swelling. You may take up to 800 mg Ibuprofen every 8 hours with food. You may supplement Ibuprofen with Tylenol [phone] mg every 8 hours.  Warm compresses Keep area clean and dry Follow up with surgery for recurrent abscess Follow up if any symptoms worsening/returning    ED Prescriptions    Medication Sig Dispense Auth. Provider   ibuprofen (ADVIL) 800 MG tablet Take 1 tablet (800 mg total) by mouth 3 (three) times daily.  21 tablet Coila Wardell, [place] C, PA-C     PDMP not reviewed this encounter.   Lew Dawes, [place] 07/07/20 1341

## 2020-07-07 NOTE — Discharge Instructions (Signed)
Use anti-inflammatories for pain/swelling. You may take up to 800 mg Ibuprofen every 8 hours with food. You may supplement Ibuprofen with Tylenol [phone] mg every 8 hours.  Warm compresses Keep area clean and dry Follow up with surgery for recurrent abscess Follow up if any symptoms worsening/returning

## 2020-07-08 DIAGNOSIS — L0501 Pilonidal cyst with abscess: Secondary | ICD-10-CM

## 2020-07-08 NOTE — ED Provider Notes (Signed)
[facility]    CSN: [id number] Arrival date & time: 07/04/20  1744      History   Chief Complaint Chief Complaint  Patient presents with  . Recurrent Skin Infections    HPI Rebekah Bennett is a 19 y.o. female.   Patient presenting today for acutely worsening abscess at top of gluteal cleft that has become significantly swollen, painful, and started draining some the past few days. Pain at this time is 9-10/10. She is unsure how long the area has been there in total but she thinks several months at least. She denies fevers, chills, sweats, body aches. Has been using warm compresses without relief. Long hx of hidradenitis with numerous recurrent skin infections. On infusion therapy for this every 8 weeks, with most recent treatment earlier today.      Past Medical History:  Diagnosis Date  . Abdominal pain   . Constipation   . Hydradenitis   . Postprandial bloating     Patient Active Problem List   Diagnosis Date Noted  . Microcytic anemia 11/01/2017  . Alpha thalassemia minor trait 11/01/2017  . Hidradenitis suppurativa 05/19/2016  . Intestinal bacterial overgrowth 11/08/2012  . Abdominal bloating 09/16/2012  . Chronic constipation   . Generalized abdominal pain     Past Surgical History:  Procedure Laterality Date  . HERNIA REPAIR      OB History   No obstetric history on file.      Home Medications    Prior to Admission medications   Medication Sig Start Date End Date Taking? Authorizing Provider  chlorhexidine (HIBICLENS) 4 % external liquid Apply topically daily as needed. 07/04/20   Particia Nearing, PA-C  cyclobenzaprine (FLEXERIL) 5 MG tablet Take 1-2 tablets (5-10 mg total) by mouth 2 (two) times daily as needed for muscle spasms. 01/03/19   Wieters, Hallie C, PA-C  HYDROcodone-acetaminophen (NORCO/VICODIN) 5-325 MG tablet Take 1 tablet by mouth 3 (three) times daily as needed for moderate pain. 07/05/20   Particia Nearing, PA-C    ibuprofen (ADVIL) 800 MG tablet Take 1 tablet (800 mg total) by mouth 3 (three) times daily. 07/07/20   Wieters, Hallie C, PA-C  inFLIXimab (REMICADE IV) Inject into the vein.    [provider]  Norethin Ace-Eth Estrad-FE (TAYTULLA PO) Take by mouth.    [provider]  sulfamethoxazole-trimethoprim (BACTRIM DS) 800-160 MG tablet Take 1 tablet by mouth 2 (two) times daily for 7 days. 07/04/20 07/11/20  Particia Nearing, PA-C  traMADol (ULTRAM) 50 MG tablet Take 1 tablet (50 mg total) by mouth every 6 (six) hours as needed. 01/06/19   Mardella Layman, MD    Family History Family History  Adopted: Yes  Problem Relation Age of Onset  . Healthy Mother     Social History Social History   Tobacco Use  . Smoking status: Never Smoker  . Smokeless tobacco: Never Used  Vaping Use  . Vaping Use: Never used  Substance Use Topics  . Alcohol use: Not Currently  . Drug use: Not Currently     Allergies   Patient has no known allergies.   Review of Systems Review of Systems PER HPI    Physical Exam Triage Vital Signs ED Triage Vitals [07/04/20 1831]  Enc Vitals Group     BP 126/69     Pulse Rate (!) 120     Resp 18     Temp 99.7 F (37.6 C)     Temp Source Oral  SpO2 100 %     Weight      Height      Head Circumference      Peak Flow      Pain Score 9     Pain Loc      Pain Edu?      Excl. in [place]?    No data found.  Updated Vital Signs BP 126/69 (BP Location: Right Arm)   Pulse (!) 120   Temp 99.7 F (37.6 C) (Oral)   Resp 18   LMP 07/01/2020   SpO2 100%   Visual Acuity Right Eye Distance:   Left Eye Distance:   Bilateral Distance:    Right Eye Near:   Left Eye Near:    Bilateral Near:     Physical Exam Vitals and nursing note reviewed.  Constitutional:      Comments: Appears in significant pain. Laying face down on exam table throughout interview as sitting is too painful. Movements very slow and antalgic throughout exam.    HENT:     Head: Atraumatic.     Mouth/Throat:     Mouth: Mucous membranes are moist.  Eyes:     Extraocular Movements: Extraocular movements intact.     Conjunctiva/sclera: Conjunctivae normal.  Cardiovascular:     Rate and Rhythm: Regular rhythm. Tachycardia present.     Heart sounds: Normal heart sounds.  Pulmonary:     Effort: Pulmonary effort is normal. No respiratory distress.     Breath sounds: Normal breath sounds.  Abdominal:     General: Bowel sounds are normal.     Palpations: Abdomen is soft.     Tenderness: There is no abdominal tenderness.  Musculoskeletal:     Cervical back: Normal range of motion and neck supple.     Comments: ROM intact but antalgic due to pilonidal abscess  Skin:    General: Skin is warm.     Comments: Numerous pustules and abscesses/scarring in groin area and between gluteal folds 2-3 inch edematous, fluctuant, exquisitely tender pilonidal abscess at top of gluteal fold. Small opening forming on lower segment but no active drainage  Neurological:     Mental Status: She is alert.  Psychiatric:        Behavior: Behavior normal.        Thought Content: Thought content normal.        Judgment: Judgment normal.      UC Treatments / Results  Labs (all labs ordered are listed, but only abnormal results are displayed) Labs Reviewed - No data to display  EKG   Radiology No results found.  Procedures Incision and Drainage  Date/Time: 07/08/2020 6:19 AM Performed by: Particia Nearing, PA-C Authorized by: Particia Nearing, PA-C   Consent:    Consent obtained:  Verbal   Consent given by:  Patient   Risks discussed:  Bleeding, incomplete drainage, infection and pain   Alternatives discussed:  Referral and alternative treatment Location:    Type:  Pilonidal cyst   Size:  2.5 inches   Location: top of gluteal fold. Pre-procedure details:    Skin preparation:  Chloraprep Anesthesia (see MAR for exact dosages):     Anesthesia method:  Local infiltration   Local anesthetic:  Lidocaine 1% w/o epi Procedure type:    Complexity:  Complex Procedure details:    Needle aspiration: no     Incision types:  Single straight   Incision depth:  Dermal   Scalpel blade:  11   Wound  management:  Probed and deloculated   Drainage:  Purulent   Drainage amount:  Copious   Wound treatment:  Wound left open   Packing materials:  1/4 in gauze Post-procedure details:    Patient tolerance of procedure:  Tolerated well, no immediate complications   (including critical care time)  Medications Ordered in UC Medications  HYDROcodone-acetaminophen (NORCO/VICODIN) 5-325 MG per tablet 1 tablet (1 tablet Oral Given 07/04/20 2010)    Initial Impression / Assessment and Plan / UC Course  I have reviewed the triage vital signs and the nursing notes.  Pertinent labs & imaging results that were available during my care of the patient were reviewed by me and considered in my medical decision making (see chart for details).     Pilonidal cyst, acute infection. Area on drainage produced copious purulent drainage and clots, large amount of packing inserted today once drained to best of ability. Dr. Tracie Harrier did assist me with procedure given extent of abscess. Patient tolerated procedure well aside from significant pain. Hydrocodone tablet given in clinic to help with pain control. We will start on bactrim, hibiclens, and small supply of norco for home pain control as needed. Discussed PCP, UC f/u in 2-3 days to remove and change out packing but that given extent of this area she should consult General Surgery about ongoing mgmt. Information for this given in AVS. Strict return precautions given for fever, continued tachycardia, worsening of area in any way.   Final Clinical Impressions(s) / UC Diagnoses   Final diagnoses:  Pilonidal abscess  Hidradenitis  Tachycardia     Discharge Instructions     [facility]  Surgery [phone] [address], [place], [place] [address]    ED Prescriptions    Medication Sig Dispense Auth. Provider   sulfamethoxazole-trimethoprim (BACTRIM DS) 800-160 MG tablet Take 1 tablet by mouth 2 (two) times daily for 7 days. 14 tablet Particia Nearing, [place]   chlorhexidine (HIBICLENS) 4 % external liquid Apply topically daily as needed. 120 mL Particia Nearing, [place]     I have reviewed the PDMP during this encounter.   Roosvelt Maser [place], [place] 07/08/20 [phone]

## 2021-06-26 ENCOUNTER — Emergency Department (HOSPITAL_BASED_OUTPATIENT_CLINIC_OR_DEPARTMENT_OTHER): Payer: BC Managed Care – PPO | Admitting: Radiology

## 2021-06-26 ENCOUNTER — Other Ambulatory Visit: Payer: Self-pay

## 2021-06-26 ENCOUNTER — Emergency Department (HOSPITAL_BASED_OUTPATIENT_CLINIC_OR_DEPARTMENT_OTHER)
Admission: EM | Admit: 2021-06-26 | Discharge: 2021-06-26 | Disposition: A | Discharge status: Home / Self Care | Payer: BC Managed Care – PPO | Attending: Emergency Medicine | Admitting: Emergency Medicine

## 2021-06-26 ENCOUNTER — Encounter (HOSPITAL_BASED_OUTPATIENT_CLINIC_OR_DEPARTMENT_OTHER): Payer: Self-pay | Admitting: Obstetrics and Gynecology

## 2021-06-26 DIAGNOSIS — J101 Influenza due to other identified influenza virus with other respiratory manifestations: Secondary | ICD-10-CM | POA: Insufficient documentation

## 2021-06-26 DIAGNOSIS — R000 Tachycardia, unspecified: Secondary | ICD-10-CM | POA: Insufficient documentation

## 2021-06-26 DIAGNOSIS — Z20822 Contact with and (suspected) exposure to covid-19: Secondary | ICD-10-CM | POA: Diagnosis not present

## 2021-06-26 DIAGNOSIS — R0981 Nasal congestion: Secondary | ICD-10-CM | POA: Diagnosis present

## 2021-06-26 LAB — RESP PANEL BY RT-PCR (FLU A&B, COVID) ARPGX2
Influenza A by PCR: POSITIVE — AB
Influenza B by PCR: NEGATIVE
SARS Coronavirus 2 by RT PCR: NEGATIVE

## 2021-06-26 MED ORDER — ACETAMINOPHEN 325 MG PO TABS
650.0000 mg | ORAL_TABLET | Freq: Once | ORAL | Status: AC | PRN
  Administered 2021-06-26: 650 mg via ORAL
  Filled 2021-06-26: qty 2

## 2021-06-26 MED ORDER — IBUPROFEN 800 MG PO TABS
800.0000 mg | ORAL_TABLET | Freq: Once | ORAL | Status: AC
  Administered 2021-06-26: 800 mg via ORAL
  Filled 2021-06-26: qty 1

## 2021-06-26 NOTE — ED Provider Notes (Signed)
[facility] EMERGENCY DEPT Provider Note   CSN: [id number] Arrival date & time: 06/26/21  1407     History Chief Complaint  Patient presents with   Nasal Congestion    Rebekah Bennett is a 20 y.o. female.  20 year old female with no past medical history presents to the ED with a chief complaint of sore throat, nasal congestion, body aches that began 3 days ago.  Patient reports all symptoms began with a scratchy throat on Monday, she then took some NyQuil along with some Alka-Seltzer without any improvement in symptoms.  Woke up with a worsening fever, with a T-max of 101.9 on arrival to the ED.  She did not take any Tylenol while at home.  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  Does report feeling overall rundown, feels body aches throughout.  Also endorsing a cough with some yellowish sputum.  She does not have any prior history of cardiac disease, no prior history of blood clots.     The history is provided by the patient and medical records.      Past Medical History:  Diagnosis Date   Abdominal pain    Constipation    Hydradenitis    Postprandial bloating     Patient Active Problem List   Diagnosis Date Noted   Microcytic anemia 11/01/2017   Alpha thalassemia minor trait 11/01/2017   Hidradenitis suppurativa 05/19/2016   Intestinal bacterial overgrowth 11/08/2012   Abdominal bloating 09/16/2012   Chronic constipation    Generalized abdominal pain     Past Surgical History:  Procedure Laterality Date   HERNIA REPAIR       OB History   No obstetric history on file.     Family History  Adopted: Yes  Problem Relation Age of Onset   Healthy Mother     Social History   Tobacco Use   Smoking status: Never   Smokeless tobacco: Never  Vaping Use   Vaping Use: Never used  Substance Use Topics   Alcohol use: Not Currently   Drug use: Not Currently    Home Medications Prior to Admission medications   Medication  Sig Start Date End Date Taking? Authorizing Provider  chlorhexidine (HIBICLENS) 4 % external liquid Apply topically daily as needed. 07/04/20   Particia Nearing, PA-C  cyclobenzaprine (FLEXERIL) 5 MG tablet Take 1-2 tablets (5-10 mg total) by mouth 2 (two) times daily as needed for muscle spasms. 01/03/19   Wieters, Hallie C, PA-C  HYDROcodone-acetaminophen (NORCO/VICODIN) 5-325 MG tablet Take 1 tablet by mouth 3 (three) times daily as needed for moderate pain. 07/05/20   Particia Nearing, PA-C  ibuprofen (ADVIL) 800 MG tablet Take 1 tablet (800 mg total) by mouth 3 (three) times daily. 07/07/20   Wieters, Hallie C, PA-C  inFLIXimab (REMICADE IV) Inject into the vein.    [provider]  Norethin Ace-Eth Estrad-FE (TAYTULLA PO) Take by mouth.    [provider]  traMADol (ULTRAM) 50 MG tablet Take 1 tablet (50 mg total) by mouth every 6 (six) hours as needed. 01/06/19   Mardella Layman, MD    Allergies    Patient has no known allergies.  Review of Systems   Review of Systems  Constitutional:  Positive for chills and fever.  HENT:  Positive for postnasal drip and sore throat.   Respiratory:  Positive for cough. Negative for shortness of breath.   Cardiovascular:  Negative for chest pain.  Gastrointestinal:  Negative for  abdominal pain, diarrhea and vomiting.  Genitourinary:  Negative for flank pain.  Musculoskeletal:  Negative for back pain.   Physical Exam Updated Vital Signs BP (!) 143/77   Pulse (!) 134   Temp (!) 101.2 F (38.4 C) (Oral)   Resp (!) 22   LMP 05/29/2021 (Approximate)   SpO2 96%   Physical Exam Vitals and nursing note reviewed.  Constitutional:      Appearance: She is ill-appearing and diaphoretic.  HENT:     Head: Normocephalic and atraumatic.     Nose: Congestion present.     Mouth/Throat:     Mouth: Mucous membranes are moist.  Eyes:     Pupils: Pupils are equal, round, and reactive to light.  Cardiovascular:     Rate and  Rhythm: Tachycardia present.  Pulmonary:     Effort: Pulmonary effort is normal.     Comments: Lungs are decreased to auscultation throughout. Abdominal:     General: Abdomen is flat.  Musculoskeletal:     Cervical back: Normal range of motion and neck supple.  Skin:    General: Skin is warm.  Neurological:     Mental Status: She is alert and oriented to person, place, and time.    ED Results / Procedures / Treatments   Labs (all labs ordered are listed, but only abnormal results are displayed) Labs Reviewed  RESP PANEL BY RT-PCR (FLU A&B, COVID) ARPGX2 - Abnormal; Notable for the following components:      Result Value   Influenza A by PCR POSITIVE (*)    All other components within normal limits    EKG None  Radiology DG Chest 2 View  Result Date: 06/26/2021 CLINICAL DATA:  Fever, cough congestion EXAM: CHEST - 2 VIEW COMPARISON:  None. FINDINGS: Cardiac and mediastinal contours are within normal limits. No focal pulmonary opacity. No pleural effusion or pneumothorax. No acute osseous abnormality. IMPRESSION: No acute cardiopulmonary process. Electronically Signed   By: Wiliam Ke M.D.   On: 06/26/2021 15:05    Procedures Procedures   Medications Ordered in ED Medications  acetaminophen (TYLENOL) tablet 650 mg (650 mg Oral Given 06/26/21 1452)  ibuprofen (ADVIL) tablet 800 mg (800 mg Oral Given 06/26/21 1615)    ED Course  I have reviewed the triage vital signs and the nursing notes.  Pertinent labs & imaging results that were available during my care of the patient were reviewed by me and considered in my medical decision making (see chart for details).  Clinical Course as of 06/26/21 1718  Wed Jun 26, 2021  1551 Influenza A By PCR(!): POSITIVE [JS]    Clinical Course User Index [JS] Claude Manges, PA-C   MDM Rules/Calculators/A&P   Patient here with 2 days of URI symptoms including nasal congestion, sore throat, body aches.  Tested positive for influenza A  on today's visit.  Did not take any Tylenol or Motrin prior to arrival for fever.  I read to the ED with a temperature of one 1.9.  During evaluation is well-appearing, heart rate is elevated in the 130s, suspect likely due to fever, temperature rechecked by me with a fever 101.2, Motrin has been given.  Her lungs are clear to auscultation without any notable rales or wheezing.  She moves all upper and lower extremities, bilateral legs without any pitting edema or calf tenderness noted.  5:16 PM Patient reevaluated by me, HR is improving to 108, she is stating at 100% with no signs of tachypnea. She is aware  she will need to continue to control her fever with tylenol or ibuprofen, patient is stable for discharge.      Portions of this note were generated with Scientist, clinical (histocompatibility and immunogenetics). Dictation errors may occur despite best attempts at proofreading.  Final Clinical Impression(s) / ED Diagnoses Final diagnoses:  Influenza A    Rx / DC Orders ED Discharge Orders     None        Claude Manges, PA-C 06/26/21 1748    Arby Barrette, MD 07/02/21 1051

## 2021-06-26 NOTE — ED Triage Notes (Signed)
Patient reports to the ER for Flu like symptoms since Monday that seem intermittent in nature.

## 2021-06-26 NOTE — Discharge Instructions (Addendum)
You tested positive for influenza A on today's visit.  He will need to alternate Tylenol and ibuprofen to help with fever.  Please continue to drink plenty of fluids in order to maintain your self hydrated.

## 2021-07-17 ENCOUNTER — Telehealth (HOSPITAL_COMMUNITY): Payer: Self-pay | Admitting: Physician Assistant

## 2021-07-17 ENCOUNTER — Ambulatory Visit (HOSPITAL_COMMUNITY)
Admission: RE | Admit: 2021-07-17 | Discharge: 2021-07-17 | Disposition: A | Discharge status: Home / Self Care | Payer: BC Managed Care – PPO | Source: Ambulatory Visit | Attending: Physician Assistant | Admitting: Physician Assistant

## 2021-07-17 ENCOUNTER — Encounter (HOSPITAL_COMMUNITY): Payer: Self-pay

## 2021-07-17 ENCOUNTER — Other Ambulatory Visit: Payer: Self-pay

## 2021-07-17 VITALS — BP 134/84 | HR 110 | Temp 98.8°F | Resp 17

## 2021-07-17 DIAGNOSIS — B2790 Infectious mononucleosis, unspecified without complication: Secondary | ICD-10-CM | POA: Insufficient documentation

## 2021-07-17 DIAGNOSIS — J029 Acute pharyngitis, unspecified: Secondary | ICD-10-CM | POA: Diagnosis not present

## 2021-07-17 LAB — POCT RAPID STREP A, ED / UC: Streptococcus, Group A Screen (Direct): NEGATIVE

## 2021-07-17 LAB — POCT INFECTIOUS MONO SCREEN, ED / UC: Mono Screen: POSITIVE — AB

## 2021-07-17 MED ORDER — PREDNISONE 20 MG PO TABS: 40.0000 mg | ORAL_TABLET | Freq: Every day | ORAL | 0 refills | Status: AC

## 2021-07-17 NOTE — ED Provider Notes (Signed)
[facility]    CSN: [id number] Arrival date & time: 07/17/21  1550      History   Chief Complaint Chief Complaint  Patient presents with   Sore Throat    APPT    HPI Rebekah Bennett is a 20 y.o. female.   Patient presents today with a several day history of swollen tonsils.  She denies any significant pain but does report that her voice is strained prompting her to look in the back of her throat.  She noticed very enlarged tonsils with white patches prompting evaluation.  She has a history of recurrent tonsillitis and has been told that she may have to have tonsils removed but has not seen an ENT.  She denies any fever, nausea, vomiting, cough, nasal congestion.  Reports she did just recover from the flu approximately a week ago and was experiencing symptoms at that time but they have since resolved.  She is chronically on antibiotics due to hidradenitis suppurativa; was previously on cefdinir for several years but recently changed to Bactrim DS.  She is taking antibiotic as prescribed.  She denies any significant pain and is able to eat and drink normally.  Denies any shortness of breath.   Past Medical History:  Diagnosis Date   Abdominal pain    Constipation    Hydradenitis    Postprandial bloating     Patient Active Problem List   Diagnosis Date Noted   Microcytic anemia 11/01/2017   Alpha thalassemia minor trait 11/01/2017   Hidradenitis suppurativa 05/19/2016   Intestinal bacterial overgrowth 11/08/2012   Abdominal bloating 09/16/2012   Chronic constipation    Generalized abdominal pain     Past Surgical History:  Procedure Laterality Date   HERNIA REPAIR      OB History   No obstetric history on file.      Home Medications    Prior to Admission medications   Medication Sig Start Date End Date Taking? Authorizing Provider  predniSONE (DELTASONE) 20 MG tablet Take 2 tablets (40 mg total) by mouth daily for 4 days. 07/17/21 07/21/21 Yes Marris Frontera,  Jayant Kriz K, PA-C  sulfamethoxazole-trimethoprim (BACTRIM DS) 800-160 MG tablet sulfamethoxazole 800 mg-trimethoprim 160 mg tablet  TAKE 1 TABLET (160 MG OF TRIMETHOPRIM TOTAL) BY MOUTH TWO (2) TIMES A DAY. 06/05/21  Yes [provider]  chlorhexidine (HIBICLENS) 4 % external liquid Apply topically daily as needed. 07/04/20   Particia NearingLane, Rachel Elizabeth, PA-C  cyclobenzaprine (FLEXERIL) 5 MG tablet Take 1-2 tablets (5-10 mg total) by mouth 2 (two) times daily as needed for muscle spasms. 01/03/19   Wieters, Hallie C, PA-C  HYDROcodone-acetaminophen (NORCO/VICODIN) 5-325 MG tablet Take 1 tablet by mouth 3 (three) times daily as needed for moderate pain. 07/05/20   Particia NearingLane, Rachel Elizabeth, PA-C  ibuprofen (ADVIL) 800 MG tablet Take 1 tablet (800 mg total) by mouth 3 (three) times daily. 07/07/20   Wieters, Hallie C, PA-C  inFLIXimab (REMICADE IV) Inject into the vein.    [provider]  Norethin Ace-Eth Estrad-FE (TAYTULLA PO) Take by mouth.    [provider]    Family History Family History  Adopted: Yes  Problem Relation Age of Onset   Healthy Mother     Social History Social History   Tobacco Use   Smoking status: Never   Smokeless tobacco: Never  Vaping Use   Vaping Use: Never used  Substance Use Topics   Alcohol use: Not Currently   Drug use: Not Currently  Allergies   Patient has no known allergies.   Review of Systems Review of Systems  Constitutional:  Negative for activity change, appetite change, fatigue and fever.  HENT:  Positive for voice change. Negative for congestion, postnasal drip, sinus pressure, sneezing, sore throat and trouble swallowing.   Respiratory:  Negative for cough and shortness of breath.   Cardiovascular:  Negative for chest pain.  Gastrointestinal:  Negative for abdominal pain, diarrhea, nausea and vomiting.  Neurological:  Negative for dizziness, light-headedness and headaches.    Physical Exam Triage Vital Signs ED  Triage Vitals  Enc Vitals Group     BP 07/17/21 1620 134/84     Pulse Rate 07/17/21 1620 (!) 110     Resp 07/17/21 1620 17     Temp 07/17/21 1620 98.8 F (37.1 C)     Temp Source 07/17/21 1620 Oral     SpO2 07/17/21 1620 96 %     Weight --      Height --      Head Circumference --      Peak Flow --      Pain Score 07/17/21 1615 0     Pain Loc --      Pain Edu? --      Excl. in [place]? --    No data found.  Updated Vital Signs BP 134/84 (BP Location: Left Arm)   Pulse (!) 110   Temp 98.8 F (37.1 C) (Oral)   Resp 17   LMP 07/03/2021   SpO2 96%   Visual Acuity Right Eye Distance:   Left Eye Distance:   Bilateral Distance:    Right Eye Near:   Left Eye Near:    Bilateral Near:     Physical Exam Vitals reviewed.  Constitutional:      General: She is awake. She is not in acute distress.    Appearance: Normal appearance. She is well-developed. She is not ill-appearing.     Comments: Very pleasant female appears stated age in no acute distress sitting comfortably in exam room  HENT:     Head: Normocephalic and atraumatic.     Right Ear: Tympanic membrane, ear canal and external ear normal. Tympanic membrane is not erythematous or bulging.     Left Ear: Tympanic membrane, ear canal and external ear normal. Tympanic membrane is not erythematous or bulging.     Nose:     Right Sinus: No maxillary sinus tenderness or frontal sinus tenderness.     Left Sinus: No maxillary sinus tenderness or frontal sinus tenderness.     Mouth/Throat:     Pharynx: Uvula midline. Posterior oropharyngeal erythema present. No oropharyngeal exudate.     Tonsils: Tonsillar exudate present. No tonsillar abscesses. 3+ on the right. 3+ on the left.  Cardiovascular:     Rate and Rhythm: Normal rate and regular rhythm.     Heart sounds: Normal heart sounds, S1 normal and S2 normal. No murmur heard. Pulmonary:     Effort: Pulmonary effort is normal.     Breath sounds: Normal breath sounds. No  wheezing, rhonchi or rales.     Comments: Clear to auscultation bilateral Lymphadenopathy:     Head:     Right side of head: No submental, submandibular or tonsillar adenopathy.     Left side of head: No submental, submandibular or tonsillar adenopathy.  Psychiatric:        Behavior: Behavior is cooperative.     UC Treatments / Results  Labs (all labs ordered  are listed, but only abnormal results are displayed) Labs Reviewed  POCT INFECTIOUS MONO SCREEN, ED / UC - Abnormal; Notable for the following components:      Result Value   Mono Screen POSITIVE (*)    All other components within normal limits  CULTURE, GROUP A STREP [facility])  POCT RAPID STREP A, ED / UC    EKG   Radiology No results found.  Procedures Procedures (including critical care time)  Medications Ordered in UC Medications - No data to display  Initial Impression / Assessment and Plan / UC Course  I have reviewed the triage vital signs and the nursing notes.  Pertinent labs & imaging results that were available during my care of the patient were reviewed by me and considered in my medical decision making (see chart for details).     Strep testing was negative.  Mono testing was positive.  Discussed this is likely cause of symptoms.  Patient was given prednisone to help with swelling/inflammation.  She was instructed not to take NSAIDs with this medication due to risk of bleeding or bleeding.  She can use Tylenol for breakthrough pain.  Discussed that she should avoid strenuous physical activity or any contact sports due to risk of splenic rupture the next 6 to 8 weeks.  Discussed alarm symptoms that warrant emergent evaluation.  Recommend she follow-up with her primary care provider within a week to ensure symptom improvement.  Strict return precautions given to which she expressed understanding.  Final Clinical Impressions(s) / UC Diagnoses   Final diagnoses:  Infectious mononucleosis without complication,  infectious mononucleosis due to unspecified organism  Sore throat     Discharge Instructions      You tested positive for mono.  To help with your swollen tonsils we are giving you prednisone.  Do not take NSAIDs including aspirin, ibuprofen/Advil, naproxen/Aleve with this medication.  Make sure you are drinking plenty of fluid.  It is very important that you avoid any strenuous physical activity or any contact sports for the next 6 to 8 weeks as you are at risk for spleen enlargement which can lead to rupture if injured.  Follow-up with your primary care provider within a week to ensure symptom improvement.  If anything worsens please return for reevaluation including difficulty speaking, difficulty swallowing, high fever, nausea, vomiting.     ED Prescriptions     Medication Sig Dispense Auth. Provider   predniSONE (DELTASONE) 20 MG tablet Take 2 tablets (40 mg total) by mouth daily for 4 days. 8 tablet Ringo Sherod, Noberto Retort, PA-C      PDMP not reviewed this encounter.   Jeani Hawking, PA-C 07/17/21 1716

## 2021-07-17 NOTE — Telephone Encounter (Signed)
Received a secure chat message from front desk staff indicating patient had called and that she is unable to take prednisone due to side effects including weight gain.  No allergies noted in record.  Attempted to call patient to discuss concerns but unable to reach her so left a message for return call.  Prednisone was given for symptomatic treatment and is not required if she has concerns about taking this medication.  There is not a medicine that will cure mono and so she can continue with warm salt water gargles and over-the-counter analgesics as needed.  If she has any questions or concerns please let me know.

## 2021-07-17 NOTE — Discharge Instructions (Signed)
You tested positive for mono.  To help with your swollen tonsils we are giving you prednisone.  Do not take NSAIDs including aspirin, ibuprofen/Advil, naproxen/Aleve with this medication.  Make sure you are drinking plenty of fluid.  It is very important that you avoid any strenuous physical activity or any contact sports for the next 6 to 8 weeks as you are at risk for spleen enlargement which can lead to rupture if injured.  Follow-up with your primary care provider within a week to ensure symptom improvement.  If anything worsens please return for reevaluation including difficulty speaking, difficulty swallowing, high fever, nausea, vomiting.

## 2021-07-17 NOTE — ED Triage Notes (Signed)
Pt presents with swollen tonsils that comes and goes with changes in voice. States yesterday developed white spots.

## 2021-07-20 LAB — CULTURE, GROUP A STREP (THRC)

## 2021-08-14 ENCOUNTER — Ambulatory Visit
Admission: RE | Admit: 2021-08-14 | Discharge: 2021-08-14 | Disposition: A | Discharge status: Home / Self Care | Payer: BC Managed Care – PPO | Source: Ambulatory Visit | Attending: Obstetrics and Gynecology | Admitting: Obstetrics and Gynecology

## 2021-08-14 ENCOUNTER — Other Ambulatory Visit: Payer: Self-pay

## 2021-08-14 ENCOUNTER — Other Ambulatory Visit: Payer: Self-pay | Admitting: Obstetrics and Gynecology

## 2021-08-14 DIAGNOSIS — R7611 Nonspecific reaction to tuberculin skin test without active tuberculosis: Secondary | ICD-10-CM

## 2021-10-25 ENCOUNTER — Ambulatory Visit (INDEPENDENT_AMBULATORY_CARE_PROVIDER_SITE_OTHER): Payer: BC Managed Care – PPO | Admitting: Nurse Practitioner

## 2021-10-25 ENCOUNTER — Other Ambulatory Visit: Payer: Self-pay

## 2021-10-25 ENCOUNTER — Encounter: Payer: Self-pay | Admitting: Nurse Practitioner

## 2021-10-25 VITALS — BP 126/82 | HR 83 | Temp 98.2°F | Ht 69.0 in | Wt 246.4 lb

## 2021-10-25 DIAGNOSIS — E669 Obesity, unspecified: Secondary | ICD-10-CM

## 2021-10-25 DIAGNOSIS — D563 Thalassemia minor: Secondary | ICD-10-CM | POA: Diagnosis not present

## 2021-10-25 DIAGNOSIS — Z13220 Encounter for screening for lipoid disorders: Secondary | ICD-10-CM | POA: Diagnosis not present

## 2021-10-25 DIAGNOSIS — Z131 Encounter for screening for diabetes mellitus: Secondary | ICD-10-CM | POA: Diagnosis not present

## 2021-10-25 DIAGNOSIS — Z8709 Personal history of other diseases of the respiratory system: Secondary | ICD-10-CM | POA: Insufficient documentation

## 2021-10-25 DIAGNOSIS — Z6836 Body mass index (BMI) 36.0-36.9, adult: Secondary | ICD-10-CM

## 2021-10-25 LAB — COMPREHENSIVE METABOLIC PANEL
ALT: 7 U/L (ref 0–35)
AST: 10 U/L (ref 0–37)
Albumin: 4 g/dL (ref 3.5–5.2)
Alkaline Phosphatase: 79 U/L (ref 39–117)
BUN: 13 mg/dL (ref 6–23)
CO2: 25 mEq/L (ref 19–32)
Calcium: 9.1 mg/dL (ref 8.4–10.5)
Chloride: 103 mEq/L (ref 96–112)
Creatinine, Ser: 0.87 mg/dL (ref 0.40–1.20)
GFR: 94.46 mL/min (ref >60.00)
Glucose, Bld: 84 mg/dL (ref 70–99)
Potassium: 3.8 mEq/L (ref 3.5–5.1)
Sodium: 136 mEq/L (ref 135–145)
Total Bilirubin: 0.3 mg/dL (ref 0.2–1.2)
Total Protein: 8 g/dL (ref 6.0–8.3)

## 2021-10-25 LAB — IBC + FERRITIN
Ferritin: 69.2 ng/mL (ref 10.0–291.0)
Iron: 49 ug/dL (ref 42–145)
Saturation Ratios: 12.3 % — ABNORMAL LOW (ref 20.0–50.0)
TIBC: 399 ug/dL (ref 250.0–450.0)
Transferrin: 285 mg/dL (ref 212.0–360.0)

## 2021-10-25 LAB — TSH: TSH: 2.5 u[IU]/mL (ref 0.35–5.50)

## 2021-10-25 LAB — LIPID PANEL
Cholesterol: 132 mg/dL (ref 0–200)
HDL: 50.3 mg/dL (ref >39.00)
LDL Cholesterol: 65 mg/dL (ref 0–99)
NonHDL: 81.64
Total CHOL/HDL Ratio: 3
Triglycerides: 81 mg/dL (ref 0.0–149.0)
VLDL: 16.2 mg/dL (ref 0.0–40.0)

## 2021-10-25 LAB — IRON: Iron: 49 ug/dL (ref 42–145)

## 2021-10-25 LAB — HEMOGLOBIN A1C: Hgb A1c MFr Bld: 5.9 % (ref 4.6–6.5)

## 2021-10-25 NOTE — Progress Notes (Signed)
? ? ? ?Subjective:  ?Patient ID: Rebekah Bennett, female    DOB: 2001-01-22  Age: 21 y.o. MRN: [id number] ? ?CC:  ?Chief Complaint  ?Patient presents with  ? New Patient (Initial Visit)  ? Referral  ?  Referral to ENT for tonsils   ?  ? ? ?HPI  ?This patient arrives today for the above. ? ?She is here to establish care as a new patient.  Her previous primary care provider was her pediatrician.  She has been without a primary care provider for a couple of years and is been using urgent care for majority of her health care. ? ?Her main concern today is that she has chronically enlarged tonsils.  She tells me she has a history of recurrent tonsillitis and would like to be evaluated by ENT to see if she be a candidate for tonsillectomy.  She tells me due to the enlargement of her tonsils she has a hard time breathing at night and snores.  She tells me she will wake up coughing and short of breath at times. ? ?She has a history of HS as well as thalassemia trait.  She does get Remicade infusions every 8 weeks and she is on Bactrim chronically as part of treatment for her HS. ? ?Past Medical History:  ?Diagnosis Date  ? Abdominal pain   ? Arthritis   ? Constipation   ? Hydradenitis   ? Postprandial bloating   ? ? ? ? ?Family History  ?Adopted: Yes  ?Problem Relation Age of Onset  ? Hypertension Mother   ? Healthy Mother   ? Hypertension Maternal Grandmother   ? Hypertension Maternal Grandfather   ? Heart disease Paternal Grandmother   ? Diabetes Paternal Grandmother   ? ? ?Social History  ? ?Social History Narrative  ? 8th grade  ? ?Social History  ? ?Tobacco Use  ? Smoking status: Never  ? Smokeless tobacco: Never  ?Substance Use Topics  ? Alcohol use: Not Currently  ? ? ? ?Current Meds  ?Medication Sig  ? chlorhexidine (HIBICLENS) 4 % external liquid Apply topically daily as needed.  ? fexofenadine (ALLEGRA) 60 MG tablet Take 60 mg by mouth daily.  ? ibuprofen (ADVIL) 800 MG tablet Take 1 tablet (800 mg total) by mouth 3  (three) times daily.  ? Norethindrone Acetate-Ethinyl Estrad-FE (HAILEY 24 FE) 1-20 MG-MCG(24) tablet Hailey 24 Fe 1 mg-20 mcg (24)/75 mg (4) tablet ? TAKE 1 TABLET BY MOUTH EVERY DAY  ? sulfamethoxazole-trimethoprim (BACTRIM DS) 800-160 MG tablet sulfamethoxazole 800 mg-trimethoprim 160 mg tablet ? TAKE 1 TABLET (160 MG OF TRIMETHOPRIM TOTAL) BY MOUTH TWO (2) TIMES A DAY.  ? ? ?ROS:  ?Review of Systems  ?Constitutional:  Negative for chills, fever, malaise/fatigue and weight loss.  ?Respiratory:  Negative for shortness of breath.   ?     (+) wakes up coughing/snores  ?Cardiovascular:  Negative for chest pain.  ?Gastrointestinal:  Negative for abdominal pain and blood in stool.  ?Neurological:  Negative for dizziness, loss of consciousness and headaches.  ? ? ?Objective:  ? ?Today's Vitals: BP 126/82   Pulse 83   Temp 98.2 ?F (36.8 ?C) (Oral)   Ht 5\' 9"  (1.753 m)   Wt 246 lb 6 oz (111.8 kg)   SpO2 97%   BMI 36.38 kg/m?  ?Vitals with BMI 10/25/2021 07/17/2021 06/26/2021  ?Height 5\' 9"  - -  ?Weight 246 lbs 6 oz - -  ?BMI 36.37 - -  ?Systolic [id number]  ?  Diastolic 82 84 73  ?Pulse 83 110 109  ?  ? ?Physical Exam ?Vitals reviewed.  ?Constitutional:   ?   General: She is not in acute distress. ?   Appearance: Normal appearance.  ?HENT:  ?   Head: Normocephalic and atraumatic.  ?   Mouth/Throat:  ?   Tonsils: 3+ on the right. 3+ on the left.  ?Neck:  ?   Vascular: No carotid bruit.  ?Cardiovascular:  ?   Rate and Rhythm: Normal rate and regular rhythm.  ?   Pulses: Normal pulses.  ?   Heart sounds: Normal heart sounds.  ?Pulmonary:  ?   Effort: Pulmonary effort is normal.  ?   Breath sounds: Normal breath sounds.  ?Skin: ?   General: Skin is warm and dry.  ?Neurological:  ?   General: No focal deficit present.  ?   Mental Status: She is alert and oriented to person, place, and time.  ?Psychiatric:     ?   Mood and Affect: Mood normal.     ?   Behavior: Behavior normal.     ?   Judgment: Judgment normal.   ? ? ? ? ? ? ? ?Assessment and Plan  ? ?1. History of recurrent tonsillitis   ?2. Thalassemia trait   ?3. Screening for diabetes mellitus   ?4. Screening, lipid   ?5. Class 2 obesity without serious comorbidity with body mass index (BMI) of 36.0 to 36.9 in adult, unspecified obesity type   ?6. Alpha thalassemia minor trait   ? ? ? ?Plan: ?1.-2., 6. See plan via problem list below. ?3.-5.  We will collect blood work for further evaluation today. ? ? ?Tests ordered ?Orders Placed This Encounter  ?Procedures  ? TSH  ? Hemoglobin A1c  ? Lipid panel  ? Comprehensive metabolic panel  ? Iron, TIBC and Ferritin Panel  ? Iron  ? IBC + Ferritin  ? Ambulatory referral to ENT  ? ? ? ? ?No orders of the defined types were placed in this encounter. ? ? ?Patient to follow-up in 1 month for annual physical exam, or sooner as needed. ? ?Ailene Ards, NP ? ?

## 2021-10-25 NOTE — Assessment & Plan Note (Signed)
Referral to ENT placed today as tonsils are clearly enlarged and seem to be affecting her breathing especially at night. ?

## 2021-10-25 NOTE — Assessment & Plan Note (Signed)
Last CBC shows mild anemia with hemoglobin 10.5.  This was taken on 10/04/21, she does report taking intermittent iron supplement over-the-counter.  We will check iron and ferritin levels today.  Further recommendations be made based upon those results. ?

## 2021-10-26 LAB — IRON,TIBC AND FERRITIN PANEL
%SAT: 17 % (calc) (ref 16–45)
Ferritin: 71 ng/mL (ref 16–154)
Iron: 61 ug/dL (ref 40–190)
TIBC: 368 mcg/dL (calc) (ref 250–450)

## 2021-11-01 ENCOUNTER — Encounter: Payer: Self-pay | Admitting: Nurse Practitioner

## 2021-11-06 NOTE — Telephone Encounter (Signed)
Pt is having tonsil stones and would like to know if there is anything else you recommend besides gargling with salt water? ? ?I have called [facility] again today and left a message with the referral coordinator asking for an update on the referral, still have not heard back.  ?

## 2021-11-07 ENCOUNTER — Other Ambulatory Visit: Payer: Self-pay | Admitting: Nurse Practitioner

## 2021-11-07 NOTE — Telephone Encounter (Signed)
Patient is requesting a new referral to be place. The ENT office cant see her until May ? ?Please advise ?

## 2021-11-07 NOTE — Progress Notes (Signed)
Re-ordering referral to ENT per patient preference as patient would like to see if she can get into a different office sooner.  ?

## 2021-12-12 ENCOUNTER — Encounter: Payer: BC Managed Care – PPO | Admitting: Nurse Practitioner

## 2021-12-27 ENCOUNTER — Encounter: Payer: Self-pay | Admitting: Nurse Practitioner

## 2021-12-27 ENCOUNTER — Ambulatory Visit (INDEPENDENT_AMBULATORY_CARE_PROVIDER_SITE_OTHER): Payer: BC Managed Care – PPO | Admitting: Nurse Practitioner

## 2021-12-27 VITALS — BP 120/80 | HR 77 | Temp 98.7°F | Ht 69.0 in | Wt 236.0 lb

## 2021-12-27 DIAGNOSIS — J353 Hypertrophy of tonsils with hypertrophy of adenoids: Secondary | ICD-10-CM

## 2021-12-27 DIAGNOSIS — R7303 Prediabetes: Secondary | ICD-10-CM | POA: Diagnosis not present

## 2021-12-27 DIAGNOSIS — M5416 Radiculopathy, lumbar region: Secondary | ICD-10-CM | POA: Diagnosis not present

## 2021-12-27 MED ORDER — LIDOCAINE 4 % EX PTCH: 1.0000 | MEDICATED_PATCH | Freq: Every day | CUTANEOUS | 2 refills | Status: AC | PRN

## 2021-12-27 NOTE — Assessment & Plan Note (Signed)
She was encouraged to follow-up with ENT and surgery as scheduled. ?
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
## 2021-12-27 NOTE — Assessment & Plan Note (Signed)
We discussed nonpharmacological therapy versus pharmacological therapy.  Patient would like to focus on lifestyle changes first.  She was congratulated on losing 10 pounds.  Encouraged to follow a healthy diet low in processed carbohydrates.  Patient will follow-up in about 3 months at which point we can recheck A1c. ?

## 2021-12-27 NOTE — Assessment & Plan Note (Signed)
She is encouraged to use over-the-counter medications such as ibuprofen and/or Tylenol as needed.  Would also recommend using lidocaine patch as needed for pain management.  Offered referral back to physical therapy, however patient would prefer to see spine specialist.  Will refer to spine specialist for further assistance and treatment options. ?

## 2022-02-12 ENCOUNTER — Other Ambulatory Visit: Payer: Self-pay | Admitting: Otolaryngology

## 2022-03-04 ENCOUNTER — Other Ambulatory Visit: Payer: Self-pay | Admitting: Orthopedic Surgery

## 2022-03-04 DIAGNOSIS — M5416 Radiculopathy, lumbar region: Secondary | ICD-10-CM

## 2022-03-14 ENCOUNTER — Ambulatory Visit
Admission: RE | Admit: 2022-03-14 | Discharge: 2022-03-14 | Disposition: A | Discharge status: Home / Self Care | Payer: BC Managed Care – PPO | Source: Ambulatory Visit | Attending: Orthopedic Surgery | Admitting: Orthopedic Surgery

## 2022-03-14 DIAGNOSIS — M5416 Radiculopathy, lumbar region: Secondary | ICD-10-CM

## 2022-03-28 ENCOUNTER — Ambulatory Visit: Payer: BC Managed Care – PPO | Admitting: Nurse Practitioner

## 2022-04-01 ENCOUNTER — Encounter (HOSPITAL_COMMUNITY): Payer: Self-pay | Admitting: Otolaryngology

## 2022-04-01 ENCOUNTER — Other Ambulatory Visit: Payer: Self-pay

## 2022-04-01 NOTE — Progress Notes (Signed)
PCP - NP Gwenette Greet  Cardiologist - Denies  EP- Denies  Endocrine- Denies  Pulm- Denies  Chest x-ray - Denies  EKG - Denies  Stress Test - Denies  ECHO - Denies  Cardiac Cath - Denies  AICD-na PM-na LOOP-na  Nerve Stimulator- Denies  Dialysis- Denies  Sleep Study - Yes- negative CPAP - Denies  LABS- 04/02/22: UPT 03/17/22 (CE): CBC w/D  ASA- Denies  ERAS- Yes- clears until 0900  HA1C- Denies  Anesthesia- No  Pt denies having chest pain, sob, or fever during the pre-op phone call. All instructions explained to the pt, with a verbal understanding of the material including: as of today, stop taking all Aspirin (unless instructed by your doctor) and Other Aspirin containing products, Vitamins, Fish oils, and Herbal medications. Also stop all NSAIDS i.e. Advil, Ibuprofen, Motrin, Aleve, Anaprox, Naproxen, BC, Goody Powders, and all Supplements. Pt also instructed to wear a mask and social distance if she goes out. The opportunity to ask questions was provided.

## 2022-04-02 ENCOUNTER — Other Ambulatory Visit: Payer: Self-pay

## 2022-04-02 ENCOUNTER — Other Ambulatory Visit (HOSPITAL_COMMUNITY): Payer: Self-pay

## 2022-04-02 ENCOUNTER — Ambulatory Visit (HOSPITAL_COMMUNITY): Payer: BC Managed Care – PPO | Admitting: Certified Registered"

## 2022-04-02 ENCOUNTER — Encounter (HOSPITAL_COMMUNITY): Payer: Self-pay | Admitting: Otolaryngology

## 2022-04-02 ENCOUNTER — Ambulatory Visit (HOSPITAL_COMMUNITY)
Admission: RE | Admit: 2022-04-02 | Discharge: 2022-04-02 | Disposition: A | Discharge status: Home / Self Care | Payer: BC Managed Care – PPO | Attending: Otolaryngology | Admitting: Otolaryngology

## 2022-04-02 ENCOUNTER — Encounter (HOSPITAL_COMMUNITY)
Admission: RE | Disposition: A | Discharge status: Home / Self Care | Payer: Self-pay | Source: Home / Self Care | Attending: Otolaryngology

## 2022-04-02 DIAGNOSIS — Z87891 Personal history of nicotine dependence: Secondary | ICD-10-CM | POA: Diagnosis not present

## 2022-04-02 DIAGNOSIS — J358 Other chronic diseases of tonsils and adenoids: Secondary | ICD-10-CM | POA: Diagnosis not present

## 2022-04-02 DIAGNOSIS — J3501 Chronic tonsillitis: Secondary | ICD-10-CM | POA: Diagnosis not present

## 2022-04-02 DIAGNOSIS — J351 Hypertrophy of tonsils: Secondary | ICD-10-CM

## 2022-04-02 HISTORY — PX: TONSILLECTOMY: SHX5217

## 2022-04-02 HISTORY — DX: Alpha thalassemia: D56.0

## 2022-04-02 LAB — POCT PREGNANCY, URINE: Preg Test, Ur: NEGATIVE

## 2022-04-02 SURGERY — TONSILLECTOMY
Anesthesia: General | Site: Throat | Laterality: Bilateral

## 2022-04-02 MED ORDER — PREDNISOLONE SODIUM PHOSPHATE 15 MG/5ML PO SOLN
15.0000 mg | Freq: Every day | ORAL | 0 refills | Status: AC
  Filled 2022-04-02: qty 15, 3d supply, fill #0

## 2022-04-02 MED ORDER — SUGAMMADEX SODIUM 200 MG/2ML IV SOLN
INTRAVENOUS | Status: DC | PRN
  Administered 2022-04-02: 200 mg via INTRAVENOUS

## 2022-04-02 MED ORDER — CHLORHEXIDINE GLUCONATE 0.12 % MT SOLN: 15.0000 mL | Freq: Once | OROMUCOSAL | Status: AC

## 2022-04-02 MED ORDER — OXYCODONE HCL 5 MG/5ML PO SOLN
ORAL | Status: AC
  Filled 2022-04-02: qty 5

## 2022-04-02 MED ORDER — FENTANYL CITRATE (PF) 100 MCG/2ML IJ SOLN
INTRAMUSCULAR | Status: AC
  Filled 2022-04-02: qty 2

## 2022-04-02 MED ORDER — ROCURONIUM BROMIDE 10 MG/ML (PF) SYRINGE
PREFILLED_SYRINGE | INTRAVENOUS | Status: DC | PRN
  Administered 2022-04-02: 60 mg via INTRAVENOUS
  Administered 2022-04-02: 20 mg via INTRAVENOUS

## 2022-04-02 MED ORDER — DEXAMETHASONE SODIUM PHOSPHATE 10 MG/ML IJ SOLN
INTRAMUSCULAR | Status: AC
  Filled 2022-04-02: qty 1

## 2022-04-02 MED ORDER — ONDANSETRON HCL 4 MG/2ML IJ SOLN
INTRAMUSCULAR | Status: AC
  Filled 2022-04-02: qty 2

## 2022-04-02 MED ORDER — SCOPOLAMINE 1 MG/3DAYS TD PT72
1.0000 | MEDICATED_PATCH | TRANSDERMAL | Status: DC
  Administered 2022-04-02: 1 via TRANSDERMAL

## 2022-04-02 MED ORDER — FENTANYL CITRATE (PF) 250 MCG/5ML IJ SOLN
INTRAMUSCULAR | Status: AC
  Filled 2022-04-02: qty 5

## 2022-04-02 MED ORDER — LACTATED RINGERS IV SOLN: INTRAVENOUS | Status: DC

## 2022-04-02 MED ORDER — HYDROCODONE-ACETAMINOPHEN 7.5-325 MG/15ML PO SOLN
15.0000 mL | ORAL | 0 refills | Status: AC | PRN
  Filled 2022-04-02: qty 630, 7d supply, fill #0

## 2022-04-02 MED ORDER — HYDROCODONE-ACETAMINOPHEN 7.5-325 MG/15ML PO SOLN
15.0000 mL | ORAL | 0 refills | Status: DC | PRN
  Filled 2022-04-02: qty 630, 7d supply, fill #0

## 2022-04-02 MED ORDER — ORAL CARE MOUTH RINSE: 15.0000 mL | Freq: Once | OROMUCOSAL | Status: AC

## 2022-04-02 MED ORDER — PROPOFOL 10 MG/ML IV BOLUS
INTRAVENOUS | Status: DC | PRN
  Administered 2022-04-02: 150 mg via INTRAVENOUS

## 2022-04-02 MED ORDER — ACETAMINOPHEN 160 MG/5ML PO SOLN: 325.0000 mg | ORAL | Status: DC | PRN

## 2022-04-02 MED ORDER — ACETAMINOPHEN 10 MG/ML IV SOLN
INTRAVENOUS | Status: AC
  Filled 2022-04-02: qty 100

## 2022-04-02 MED ORDER — MIDAZOLAM HCL 2 MG/2ML IJ SOLN
INTRAMUSCULAR | Status: DC | PRN
  Administered 2022-04-02: 2 mg via INTRAVENOUS

## 2022-04-02 MED ORDER — DOCUSATE SODIUM 100 MG PO CAPS
100.0000 mg | ORAL_CAPSULE | Freq: Two times a day (BID) | ORAL | 0 refills | Status: AC | PRN
  Filled 2022-04-02: qty 20, 10d supply, fill #0

## 2022-04-02 MED ORDER — ACETAMINOPHEN 325 MG PO TABS: 325.0000 mg | ORAL_TABLET | ORAL | Status: DC | PRN

## 2022-04-02 MED ORDER — OXYMETAZOLINE HCL 0.05 % NA SOLN
NASAL | Status: DC | PRN
  Administered 2022-04-02: 1 via TOPICAL

## 2022-04-02 MED ORDER — ALBUTEROL SULFATE HFA 108 (90 BASE) MCG/ACT IN AERS
INHALATION_SPRAY | RESPIRATORY_TRACT | Status: DC | PRN
  Administered 2022-04-02 (×2): 2 via RESPIRATORY_TRACT

## 2022-04-02 MED ORDER — OXYCODONE HCL 5 MG/5ML PO SOLN
5.0000 mg | Freq: Once | ORAL | Status: AC | PRN
  Administered 2022-04-02: 5 mg via ORAL

## 2022-04-02 MED ORDER — PROMETHAZINE HCL 25 MG/ML IJ SOLN: 6.2500 mg | INTRAMUSCULAR | Status: DC | PRN

## 2022-04-02 MED ORDER — FENTANYL CITRATE (PF) 250 MCG/5ML IJ SOLN
INTRAMUSCULAR | Status: DC | PRN
  Administered 2022-04-02: 50 ug via INTRAVENOUS
  Administered 2022-04-02: 100 ug via INTRAVENOUS

## 2022-04-02 MED ORDER — AMISULPRIDE (ANTIEMETIC) 5 MG/2ML IV SOLN
10.0000 mg | Freq: Once | INTRAVENOUS | Status: DC | PRN
Start: 2022-04-02 — End: 2022-04-02

## 2022-04-02 MED ORDER — PROPOFOL 10 MG/ML IV BOLUS
INTRAVENOUS | Status: AC
  Filled 2022-04-02: qty 20

## 2022-04-02 MED ORDER — CHLORHEXIDINE GLUCONATE 0.12 % MT SOLN
OROMUCOSAL | Status: AC
  Administered 2022-04-02: 15 mL via OROMUCOSAL
  Filled 2022-04-02: qty 15

## 2022-04-02 MED ORDER — MIDAZOLAM HCL 2 MG/2ML IJ SOLN
INTRAMUSCULAR | Status: AC
  Filled 2022-04-02: qty 2

## 2022-04-02 MED ORDER — OXYCODONE HCL 5 MG PO TABS: 5.0000 mg | ORAL_TABLET | Freq: Once | ORAL | Status: AC | PRN

## 2022-04-02 MED ORDER — 0.9 % SODIUM CHLORIDE (POUR BTL) OPTIME
TOPICAL | Status: DC | PRN
  Administered 2022-04-02: 1000 mL

## 2022-04-02 MED ORDER — ONDANSETRON HCL 4 MG/2ML IJ SOLN
INTRAMUSCULAR | Status: DC | PRN
  Administered 2022-04-02: 4 mg via INTRAVENOUS

## 2022-04-02 MED ORDER — ACETAMINOPHEN 10 MG/ML IV SOLN
1000.0000 mg | Freq: Once | INTRAVENOUS | Status: DC | PRN
  Administered 2022-04-02: 1000 mg via INTRAVENOUS

## 2022-04-02 MED ORDER — FENTANYL CITRATE (PF) 100 MCG/2ML IJ SOLN
25.0000 ug | INTRAMUSCULAR | Status: DC | PRN
  Administered 2022-04-02: 25 ug via INTRAVENOUS

## 2022-04-02 MED ORDER — DEXAMETHASONE SODIUM PHOSPHATE 10 MG/ML IJ SOLN
INTRAMUSCULAR | Status: DC | PRN
  Administered 2022-04-02: 10 mg via INTRAVENOUS

## 2022-04-02 MED ORDER — LIDOCAINE 2% (20 MG/ML) 5 ML SYRINGE
INTRAMUSCULAR | Status: DC | PRN
  Administered 2022-04-02: 60 mg via INTRAVENOUS

## 2022-04-02 SURGICAL SUPPLY — 30 items
BAG COUNTER SPONGE SURGICOUNT (BAG) ×3 IMPLANT
BAG SPNG CNTER NS LX DISP (BAG) ×1
CANISTER SUCT 3000ML PPV (MISCELLANEOUS) ×3 IMPLANT
CATH ROBINSON RED A/P 10FR (CATHETERS) IMPLANT
CATH ROBINSON RED A/P 12FR (CATHETERS) ×3 IMPLANT
CLEANER TIP ELECTROSURG 2X2 (MISCELLANEOUS) ×3 IMPLANT
COAGULATOR SUCT SWTCH 10FR 6 (ELECTROSURGICAL) ×3 IMPLANT
CONT SPEC 4OZ CLIKSEAL STRL BL (MISCELLANEOUS) ×3 IMPLANT
ELECT COATED BLADE 2.86 ST (ELECTRODE) ×3 IMPLANT
ELECT REM PT RETURN 9FT ADLT (ELECTROSURGICAL)
ELECT REM PT RETURN 9FT PED (ELECTROSURGICAL)
ELECTRODE REM PT RETRN 9FT PED (ELECTROSURGICAL) IMPLANT
ELECTRODE REM PT RTRN 9FT ADLT (ELECTROSURGICAL) IMPLANT
GAUZE 4X4 16PLY ~~LOC~~+RFID DBL (SPONGE) ×3 IMPLANT
GLOVE BIO SURGEON STRL SZ 6.5 (GLOVE) ×3 IMPLANT
GOWN STRL REUS W/ TWL LRG LVL3 (GOWN DISPOSABLE) ×4 IMPLANT
GOWN STRL REUS W/TWL LRG LVL3 (GOWN DISPOSABLE) ×4
KIT BASIN OR (CUSTOM PROCEDURE TRAY) ×3 IMPLANT
KIT TURNOVER KIT B (KITS) ×3 IMPLANT
NS IRRIG 1000ML POUR BTL (IV SOLUTION) ×3 IMPLANT
PACK SURGICAL SETUP 50X90 (CUSTOM PROCEDURE TRAY) ×3 IMPLANT
PAD ARMBOARD 7.5X6 YLW CONV (MISCELLANEOUS) IMPLANT
PENCIL SMOKE EVACUATOR (MISCELLANEOUS) ×3 IMPLANT
POSITIONER HEAD DONUT 9IN (MISCELLANEOUS) ×3 IMPLANT
SPONGE TONSIL 1.25 RF SGL STRG (GAUZE/BANDAGES/DRESSINGS) ×3 IMPLANT
SYR BULB EAR ULCER 3OZ GRN STR (SYRINGE) ×3 IMPLANT
TOWEL GREEN STERILE FF (TOWEL DISPOSABLE) ×3 IMPLANT
TUBE CONNECTING 12X1/4 (SUCTIONS) ×3 IMPLANT
TUBE SALEM SUMP 16 FR W/ARV (TUBING) ×3 IMPLANT
YANKAUER SUCT BULB TIP NO VENT (SUCTIONS) ×3 IMPLANT

## 2022-04-02 NOTE — Anesthesia Procedure Notes (Signed)
Procedure Name: Intubation Date/Time: 04/02/2022 12:07 PM  Performed by: Elliot Dally, CRNAPre-anesthesia Checklist: Patient identified, Emergency Drugs available, Suction available and Patient being monitored Patient Re-evaluated:Patient Re-evaluated prior to induction Oxygen Delivery Method: Circle System Utilized Preoxygenation: Pre-oxygenation with 100% oxygen Induction Type: IV induction Ventilation: Mask ventilation without difficulty and Oral airway inserted - appropriate to patient size Laryngoscope Size: Glidescope and 4 Grade View: Grade I Tube type: Oral Tube size: 7.0 mm Number of attempts: 1 Airway Equipment and Method: Stylet and Oral airway Placement Confirmation: ETT inserted through vocal cords under direct vision, positive ETCO2 and breath sounds checked- equal and bilateral Tube secured with: Tape Dental Injury: Teeth and Oropharynx as per pre-operative assessment  Comments: Initial grade 1 view with Miller 2. ETT advanced through cords, no ETCO2. Suspected severe bronchospasm. Glide scope used for second intubation to ensure proper ETT placement.

## 2022-04-02 NOTE — Transfer of Care (Signed)
Immediate Anesthesia Transfer of Care Note  Patient: Timberlee E Radice  Procedure(s) Performed: TONSILLECTOMY (Bilateral: Throat)  Patient Location: PACU  Anesthesia Type:General  Level of Consciousness: drowsy  Airway & Oxygen Therapy: Patient Spontanous Breathing and Patient connected to nasal cannula oxygen  Post-op Assessment: Report given to RN and Post -op Vital signs reviewed and stable  Post vital signs: Reviewed and stable  Last Vitals:  Vitals Value Taken Time  BP 124/59 04/02/22 1300  Temp 36.5 C 04/02/22 1255  Pulse 97 04/02/22 1304  Resp 18 04/02/22 1304  SpO2 91 % 04/02/22 1304  Vitals shown include unvalidated device data.  Last Pain:  Vitals:   04/02/22 1255  TempSrc:   PainSc: 7       Patients Stated Pain Goal: 2 (04/01/22 0942)  Complications: No notable events documented.

## 2022-04-02 NOTE — Anesthesia Preprocedure Evaluation (Addendum)
Anesthesia Evaluation  Patient identified by MRN, date of birth, ID band Patient awake    Reviewed: Allergy & Precautions, NPO status , Patient's Chart, lab work & pertinent test results  Airway Mallampati: III  TM Distance: >3 FB Neck ROM: Full    Dental  (+) Teeth Intact, Dental Advisory Given   Pulmonary neg pulmonary ROS,    breath sounds clear to auscultation       Cardiovascular negative cardio ROS   Rhythm:Regular Rate:Normal     Neuro/Psych negative psych ROS   GI/Hepatic negative GI ROS, Neg liver ROS,   Endo/Other  negative endocrine ROS  Renal/GU negative Renal ROS     Musculoskeletal  (+) Arthritis ,   Abdominal (+) + obese,   Peds  Hematology negative hematology ROS (+)   Anesthesia Other Findings   Reproductive/Obstetrics                            Anesthesia Physical Anesthesia Plan  ASA: 2  Anesthesia Plan: General   Post-op Pain Management:    Induction: Intravenous  PONV Risk Score and Plan: 4 or greater and Ondansetron, Dexamethasone, Midazolam and Scopolamine patch - Pre-op  Airway Management Planned: Oral ETT  Additional Equipment: None  Intra-op Plan:   Post-operative Plan: Extubation in OR  Informed Consent: I have reviewed the patients History and Physical, chart, labs and discussed the procedure including the risks, benefits and alternatives for the proposed anesthesia with the patient or authorized representative who has indicated his/her understanding and acceptance.     Dental advisory given  Plan Discussed with: CRNA  Anesthesia Plan Comments:        Anesthesia Quick Evaluation

## 2022-04-02 NOTE — Discharge Instructions (Signed)
Tonsillectomy Post Operative Instructions   Effects of Anesthesia Tonsillectomy (with or without Adenoidectomy) involves a brief anesthesia,  typically 20 - 60 minutes. Patients may be quite irritable for several hours after  surgery. If sedatives were given, some patients will remain sleepy for much of the  day. Nausea and vomiting is occasionally seen, and usually resolves by the  evening of surgery - even without additional medications.  Medications Tonsillectomy is a painful procedure. Pain medications help but do not  completely alleviate the discomfort.   ADULTS  Adults will be prescribed a narcotic pain pill or elixir (Percocet, Norco,  Vicodin, Lortab are some examples). Do not use aspirin products (Bayer's,  Goode powders, Excedrin) - they may increase the chance of bleeding.  Every time you take a dose of pain medication, do so with some food or full  liquid to prevent nausea. The best thing to take with the medication is a  cup of pudding or ice cream, a milkshake or cup of milk.   Activity  Vigorous exercise should be avoided for 14 days after surgery. This risk of  bleeding is increased with increased activity and bleeding from where the tonsils  were removed can happen for up to 2 weeks after surgery. Baths and showers are fine. Many patients have reduced energy levels until their pain decreases and  they are taking in more nourishment and calories. You should not travel out of  the local area for a full 2 weeks after surgery in case you experience bleeding  after surgery.   Eating & Drinking Dehydration is the biggest enemy in the recovery period. It will increase the pain,  increase the risk of bleeding and delay the healing. It usually happens because  the pain of swallowing keeps the patient from drinking enough liquids. Therefore,  the key is to force fluids, and that works best when pain control is maximized. You cannot drink too much after having a  tonsillectomy. The only drinks to avoid  are citrus like orange and grapefruit juices because they will burn the back of the  throat. Incentive charts with prizes work very well to get young children to drink  fluids and take their medications after surgery. Some patients will have a small  amount of liquid come out of their nose when they drink after surgery, this should  stop within a few weeks after surgery.  Although drinking is more important, eating is fine even the day of surgery but  avoid foods that are crunchy or have sharp edges. Dairy products may be taken,  if desired. You should avoid acidic, salty and spicy foods (especially tomato  sauces). Chewing gum or bubble gum encourages swallowing and saliva flow,  and may even speed up the healing. Almost everyone loses some weight after  tonsillectomy (which is usually regained in the 2nd or 3rd week after surgery).  Drinking is far more important that eating in the first 14 days after surgery, so  concentrate on that first and foremost. Adequate liquid intake probably speeds  Recovery.  Other things.  Pain is usually the worst in the morning; this can be avoided by overnight  medication administration if needed.  Since moisture helps soothe the healing throat, a room humidifier (hot or  cold) is suggested when the patient is sleeping.  Some patients feel pain relief with an ice collar to the neck (or a bag of  frozen peas or corn). Be careful to avoid placing cold plastic directly on  the  skin - wrap in a paper towel or washcloth.   If the tonsils and adenoids are very large, the patient's voice may change  after surgery.  The recovery from tonsillectomy is a very painful period, often the worst  pain people can recall, so please be understanding and patient with  yourself, or the patient you are caring for. It is helpful to take pain  medicine during the night if the patient awakens-- the worst pain is usually  in the morning.  The pain may seem to increase 2-5 days after surgery - this is normal when inflammation sets in. Please be aware that no  combination of medicines will eliminate the pain - the patient will need to  continue eating/drinking in spite of the remaining discomfort.  You should not travel outside of the local area for 14 days after surgery in  case significant bleeding occurs.   What should we expect after surgery? As previously mentioned, most patients have a significant amount of pain after  tonsillectomy, with pain resolving 7-14 days after surgery. Older children and  adults seem to have more discomfort. Most patients can go home the day of  surgery.  Ear pain: Many people will complain of earaches after tonsillectomy. This  is caused by referred pain coming from throat and not the ears. Give pain  medications and encourage liquid intake.  Fever: Many patients have a low-grade fever after tonsillectomy - up to  101.5 degrees (380 C.) for several days. Higher prolonged fever should be  reported to your surgeon.  Bad looking (and bad smelling) throat: After surgery, the place where  the tonsils were removed is covered with a white film, which is a moist  scab. This usually develops 3-5 days after surgery and falls off 10-14 days  after surgery and usually causes bad breath. There will be some redness  and swelling as well. The uvula (the part of the throat that hangs down in  the middle between the tonsils) is usually swollen for several days after  surgery.  Sore/bruised feeling of Tongue: This is common for the first few days  after surgery because the tongue is pushed out of the way to take out the  tonsils in surgery.  When should we call the doctor?  Nausea/Vomiting: This is a common side effect from General Anesthesia  and can last up to 24-36 hours after surgery. Try giving sips of clear liquids  like Sprite, water or apple juice then gradually increase fluid intake. If the   nausea or vomiting continues beyond this time frame, call the doctor's  office for medications that will help relieve the nausea and vomiting.  Bleeding: Significant bleeding is rare, but it happens to about 5% of  patients who have tonsillectomy. It may come from the nose, the mouth, or  be vomited or coughed up. Ice water mouthwashes may help stop or  reduce bleeding. If you have bleeding that does not stop, you should call  the office (during business hours) or the on call physician (evenings, weekends) or go to the emergency room if you are very concerned.   Dehydration: If there has been little or no liquids intake for 24 hours, the  patient may need to come to the hospital for IV fluids. Signs of dehydration  include lethargy, the lack of tears when crying, and reduced or very  concentrated urine output.  High Fever: If the patient has a consistent temperatures greater than 102,  or  when accompanied by cough or difficulty breathing, you should call the  doctor's office.

## 2022-04-02 NOTE — H&P (Signed)
Rebekah Bennett is an 21 y.o. female.    Chief Complaint:  Tonsillar hypertrophy  HPI: Patient presents today for planned elective procedure.  She denies any interval change in history since office visit on 12/24/2021:  Rebekah Bennett is a 21 y.o. female who presents as a new consult, referred by Rebekah Bennett, *, for evaluation and treatment of tonsillar hypertrophy, dysphagia and chronic tonsillitis. Per patient, she has had large tonsils her whole life, has had issues with recurrent strep throat for many years. She has not had strep since 2019, she but she states that since 2019, her tonsils have not gone back down in size after her infection. She states that she experiences tonsil stones, which fluctuate in severity, but have not responded to diligent oral hygiene. She states that with the tonsillar edema, she occasionally experiences dysphagia and odynophagia and a "choking sensation" when she wakes up in the mornings. Denies symptoms of allergic rhinitis or nasal congestion. Patient states she is a former smoker.  Past Medical History:  Diagnosis Date   Abdominal pain    Alpha (0) thalassemia (HCC)    Trait   Arthritis    Constipation    Hydradenitis    Postprandial bloating     Past Surgical History:  Procedure Laterality Date   AXILLARY SURGERY Left    For hydradenitis   HERNIA REPAIR     WISDOM TOOTH EXTRACTION      Family History  Adopted: Yes  Problem Relation Age of Onset   Hypertension Mother    Healthy Mother    Hypertension Maternal Grandmother    Hypertension Maternal Grandfather    Heart disease Paternal Grandmother    Diabetes Paternal Grandmother     Social History:  reports that she has never smoked. She has never used smokeless tobacco. She reports current alcohol use. She reports that she does not currently use drugs.  Allergies: No Known Allergies  Medications Prior to Admission  Medication Sig Dispense Refill   cyclobenzaprine (FLEXERIL) 10 MG  tablet Take 10 mg by mouth 3 (three) times daily as needed for muscle spasms.     inFLIXimab (REMICADE IV) Inject 1,100 mg into the vein every 8 (eight) weeks.     lidocaine (HM LIDOCAINE PATCH) 4 % Place 1 patch onto the skin daily as needed (low back pain). 30 patch 2   Norethindrone Acetate-Ethinyl Estrad-FE (HAILEY 24 FE) 1-20 MG-MCG(24) tablet Take 1 tablet by mouth daily.     sulfamethoxazole-trimethoprim (BACTRIM DS) 800-160 MG tablet Take 1 tablet by mouth at bedtime.     methocarbamol (ROBAXIN) 500 MG tablet Take 500 mg by mouth every 8 (eight) hours as needed for muscle spasms.      No results found for this or any previous visit (from the past 48 hour(s)). No results found.  ROS: ROS  Blood pressure 138/79, pulse 89, temperature 99 F (37.2 C), temperature source Oral, resp. rate 18, height 5\' 9"  (1.753 m), weight 104.8 kg, last menstrual period 03/07/2022, SpO2 95 %.  PHYSICAL EXAM: Physical Exam HENT:     Right Ear: External ear normal.     Left Ear: External ear normal.     Nose: Nose normal.  Pulmonary:     Effort: Pulmonary effort is normal.  Musculoskeletal:     Cervical back: Normal range of motion.  Neurological:     General: No focal deficit present.     Mental Status: She is alert.  Psychiatric:  Mood and Affect: Mood normal.        Behavior: Behavior normal.     Studies Reviewed: None   Assessment/Plan Rebekah Bennett is a 21 y.o. female with history of chronic tonsillitis and tonsil stones, unresponsive despite maximal medical therapy.  -To OR today for tonsillectomy. The risks, benefits and possible complications of the procedure were discussed in detail with the patient's family. Postoperative risks of dehydration, infection, and bleeding were discussed in detail. The anticipated 10-14 day recovery was emphasized. All questions answered.   Rebekah Bennett A Rebekah Bennett 04/02/2022, 9:40 AM

## 2022-04-02 NOTE — Op Note (Signed)
OPERATIVE NOTE  AKILAH CURETON Date/Time of Admission: 04/02/2022  9:06 AM  CSN: [id number];DJM:[id number] Attending Provider: Cheron Schaumann A, DO Room/Bed: MCPO/NONE DOB: 04-19-2001 Age: 21 y.o.   Pre-Op Diagnosis: Tonsillar hypertrophy Chronic tonsillitis  Post-Op Diagnosis: Tonsillar hypertrophy Chronic tonsillitis  Procedure: Procedure(s): TONSILLECTOMY  Anesthesia: General  Surgeon(s): Martin Belling A Tabria Steines, DO  Staff: Circulator: Eual Fines, RN Relief Circulator: Josefa Half, RN Scrub Person: Hubbard Hartshorn, Jaclyn Shaggy, CST  Implants: * No implants in log *  Specimens: ID Type Source Tests Collected by Time Destination  1 : Bilateral Tonsils Tissue PATH Tonsil/Adenoid SURGICAL PATHOLOGY Garth Diffley A, DO 04/02/2022 1222     Complications: None  EBL: 5 ML  Condition: stable  Operative Findings:  3-4+ tonsils, cryptic with multiple tonsil stones   Description of Operation: Once operative consent was obtained, and the surgical site confirmed with the operating room team, the patient was brought back to the operating room and general endotracheal anesthesia was obtained. The patient was turned over to the ENT service. A Crow-Davis mouth gag was used to expose the oral cavity and oropharynx. A red rubber catheter was placed from the right nasal cavity to the oral cavity to retract the soft palate. Attention was first turned to the right tonsil, which was excised at the level of the capsule using electrocautery. Hemostasis was obtained and the tonsillar fossa and the tonsil was sent for specimen. The exact procedure was repeated on the left side. The patient was relieved from oral suspension and then placed back in oral suspension to assure hemostasis, which was obtained. An oral gastric tube was placed into the stomach and suctioned to reduce postoperative nausea. The patient was turned back over to the anesthesia service and extubated in the room  without event. The patient was then transferred to the PACU in stable condition.    Laren Boom, DO [facility]  04/02/2022

## 2022-04-02 NOTE — Anesthesia Postprocedure Evaluation (Signed)
Anesthesia Post Note  Patient: Rebekah Bennett  Procedure(s) Performed: TONSILLECTOMY (Bilateral: Throat)     Patient location during evaluation: PACU Anesthesia Type: General Level of consciousness: awake and alert Pain management: pain level controlled Vital Signs Assessment: post-procedure vital signs reviewed and stable Respiratory status: spontaneous breathing, nonlabored ventilation, respiratory function stable and patient connected to nasal cannula oxygen Cardiovascular status: blood pressure returned to baseline and stable Postop Assessment: no apparent nausea or vomiting Anesthetic complications: no   No notable events documented.  Last Vitals:  Vitals:   04/02/22 1330 04/02/22 1345  BP: (!) 117/57 127/80  Pulse: 75 86  Resp: 16 13  Temp:  36.5 C  SpO2: 95% 98%                [name]

## 2022-04-03 ENCOUNTER — Encounter (HOSPITAL_COMMUNITY): Payer: Self-pay | Admitting: Otolaryngology

## 2022-04-03 LAB — SURGICAL PATHOLOGY

## 2022-04-09 ENCOUNTER — Other Ambulatory Visit (HOSPITAL_COMMUNITY): Payer: Self-pay

## 2022-05-22 ENCOUNTER — Other Ambulatory Visit: Payer: Self-pay | Admitting: Nurse Practitioner

## 2022-05-22 ENCOUNTER — Ambulatory Visit: Payer: BC Managed Care – PPO | Admitting: Nurse Practitioner

## 2022-05-22 ENCOUNTER — Encounter: Payer: Self-pay | Admitting: Nurse Practitioner

## 2022-05-22 VITALS — BP 122/78 | HR 81 | Temp 97.8°F | Ht 69.0 in | Wt 243.1 lb

## 2022-05-22 DIAGNOSIS — D649 Anemia, unspecified: Secondary | ICD-10-CM

## 2022-05-22 DIAGNOSIS — R7303 Prediabetes: Secondary | ICD-10-CM | POA: Diagnosis not present

## 2022-05-22 DIAGNOSIS — M5416 Radiculopathy, lumbar region: Secondary | ICD-10-CM

## 2022-05-22 DIAGNOSIS — D509 Iron deficiency anemia, unspecified: Secondary | ICD-10-CM

## 2022-05-22 LAB — BASIC METABOLIC PANEL
BUN: 12 mg/dL (ref 6–23)
CO2: 23 mEq/L (ref 19–32)
Calcium: 9.5 mg/dL (ref 8.4–10.5)
Chloride: 105 mEq/L (ref 96–112)
Creatinine, Ser: 0.97 mg/dL (ref 0.40–1.20)
GFR: 82.56 mL/min (ref >60.00)
Glucose, Bld: 81 mg/dL (ref 70–99)
Potassium: 4.4 mEq/L (ref 3.5–5.1)
Sodium: 136 mEq/L (ref 135–145)

## 2022-05-22 LAB — CBC
HCT: 34.2 % — ABNORMAL LOW (ref 36.0–46.0)
Hemoglobin: 10.8 g/dL — ABNORMAL LOW (ref 12.0–15.0)
MCHC: 31.6 g/dL (ref 30.0–36.0)
MCV: 69.5 fl — ABNORMAL LOW (ref 78.0–100.0)
Platelets: 381 10*3/uL (ref 150.0–400.0)
RBC: 4.92 Mil/uL (ref 3.87–5.11)
RDW: 14.9 % (ref 11.5–15.5)
WBC: 8 10*3/uL (ref 4.0–10.5)

## 2022-05-22 LAB — IRON: Iron: 41 ug/dL — ABNORMAL LOW (ref 42–145)

## 2022-05-22 LAB — FERRITIN: Ferritin: 70 ng/mL (ref 10.0–291.0)

## 2022-05-22 NOTE — Progress Notes (Signed)
Established Patient Office Visit  Subjective   Patient ID: Rebekah Bennett, female    DOB: Dec 29, 2000  Age: 21 y.o. MRN: [id number]  Chief Complaint  Patient presents with   Follow-up   Prediabetes    Patient also follow-up for the above.  Prediabetes: History of prediabetes last A1c 5.9.  She had been working on lifestyle changes, however underwent tonsillectomy.  She reports that she still recovering from that and has not had much energy since her surgery.  She plans on really focusing on diet and exercise here in the near future.  Anemia/fatigue: Last CBC collected outside the [facility] system showed microcytic anemia with hemoglobin of 10.4.  Patient has alpha thalassemia trait, she has been on iron supplementation in the past.  She is on oral concept of pills and still menstruates regularly.  She reports she is been feeling a bit more fatigued since she had her surgery and is slowly recovering and getting energy back.  She thinks she may need iron supplementation again.  Low back pain: She did see a neurosurgeon and because surgery was not recommended, she was referred to orthopedist.  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  She is now scheduled to see neurology within the next couple of weeks.  She continues to participate in massage therapy as well.    Review of Systems  Constitutional:  Negative for fever.  Respiratory:  Negative for shortness of breath.   Cardiovascular:  Negative for chest pain and palpitations.      Objective:     BP 122/78   Pulse 81   Temp 97.8 F (36.6 C) (Oral)   Ht 5\' 9"  (1.753 m)   Wt 243 lb 2 oz (110.3 kg)   SpO2 97%   BMI 35.90 kg/m    Physical Exam Vitals reviewed.  Constitutional:      General: She is not in acute distress.    Appearance: Normal appearance.  HENT:     Head: Normocephalic and atraumatic.  Neck:     Vascular: No carotid bruit.  Cardiovascular:     Rate and  Rhythm: Normal rate and regular rhythm.     Pulses: Normal pulses.     Heart sounds: Normal heart sounds.  Pulmonary:     Effort: Pulmonary effort is normal.     Breath sounds: Normal breath sounds.  Skin:    General: Skin is warm and dry.  Neurological:     General: No focal deficit present.     Mental Status: She is alert and oriented to person, place, and time.  Psychiatric:        Mood and Affect: Mood normal.        Behavior: Behavior normal.        Judgment: Judgment normal.      No results found for any visits on 05/22/22.    The ASCVD Risk score (Arnett DK, et al., 2019) failed to calculate for the following reasons:   The 2019 ASCVD risk score is only valid for ages 5 to 39    Assessment & Plan:   Problem List Items Addressed This Visit       Nervous and Auditory   Lumbar back pain with radiculopathy affecting left lower extremity    Chronic, stable, suboptimally controlled.  Patient recommended to follow-up with neurology as currently scheduled.  Patient reports understanding.        Other   Microcytic anemia  We will check iron and ferritin level, if iron and ferritin is low will recommend restarting over-the-counter iron supplementation.  Further recommendations will be made based upon results.      Prediabetes    Per shared decision making we will hold off on rechecking A1c today as patient plans on reinitiating lifestyle changes within the next week or so.  We will plan on rechecking this at subsequent office visit.      Anemia - Primary    See plan under diagnosis of microcytic anemia.      Relevant Orders   Iron   Ferritin   CBC   Basic metabolic panel    Return in about 6 months (around 11/20/2022) for CPE with Maralyn Sago.    Elenore Paddy, NP

## 2022-05-22 NOTE — Assessment & Plan Note (Signed)
We will check iron and ferritin level, if iron and ferritin is low will recommend restarting over-the-counter iron supplementation.  Further recommendations will be made based upon results.

## 2022-05-22 NOTE — Assessment & Plan Note (Signed)
Per shared decision making we will hold off on rechecking A1c today as patient plans on reinitiating lifestyle changes within the next week or so.  We will plan on rechecking this at subsequent office visit.

## 2022-05-22 NOTE — Assessment & Plan Note (Signed)
Chronic, stable, suboptimally controlled.  Patient recommended to follow-up with neurology as currently scheduled.  Patient reports understanding.

## 2022-05-22 NOTE — Assessment & Plan Note (Signed)
See plan under diagnosis of microcytic anemia.

## 2022-06-02 ENCOUNTER — Telehealth: Payer: Self-pay | Admitting: Neurology

## 2022-06-02 ENCOUNTER — Encounter: Payer: Self-pay | Admitting: Neurology

## 2022-06-02 ENCOUNTER — Ambulatory Visit: Payer: BC Managed Care – PPO | Admitting: Neurology

## 2022-06-02 VITALS — BP 120/79 | HR 84 | Ht 69.0 in | Wt 240.8 lb

## 2022-06-02 DIAGNOSIS — M549 Dorsalgia, unspecified: Secondary | ICD-10-CM | POA: Diagnosis not present

## 2022-06-02 DIAGNOSIS — R29898 Other symptoms and signs involving the musculoskeletal system: Secondary | ICD-10-CM

## 2022-06-02 DIAGNOSIS — M5416 Radiculopathy, lumbar region: Secondary | ICD-10-CM

## 2022-06-02 DIAGNOSIS — M79605 Pain in left leg: Secondary | ICD-10-CM | POA: Diagnosis not present

## 2022-06-02 DIAGNOSIS — G8929 Other chronic pain: Secondary | ICD-10-CM

## 2022-06-02 DIAGNOSIS — M5412 Radiculopathy, cervical region: Secondary | ICD-10-CM

## 2022-06-02 DIAGNOSIS — M79602 Pain in left arm: Secondary | ICD-10-CM

## 2022-06-02 MED ORDER — GABAPENTIN 300 MG PO CAPS: 300.0000 mg | ORAL_CAPSULE | Freq: Two times a day (BID) | ORAL | 11 refills | Status: DC
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
## 2022-06-02 NOTE — Telephone Encounter (Signed)
Referral for pain clinic sent to [facility] with Dr. Suella Broad. Phone: [phone], Fax: [phone]

## 2022-06-02 NOTE — Patient Instructions (Addendum)
[facility] for evaluation of left lower extremity hip or low back injections due to leg pain and radicular symptoms(numbness, pain down the leg) Gabapentin : nerve pain medication can take it 2x a day but watch for sedation so try it at night first MRI cervical spine to see if there is any issues in the cervical spine that could cause left-sided symptoms  Meds ordered this encounter  Medications   gabapentin (NEURONTIN) 300 MG capsule    Sig: Take 1 capsule (300 mg total) by mouth 2 (two) times daily.    Dispense:  60 capsule    Refill:  11   Orders Placed This Encounter  Procedures   MR CERVICAL SPINE WO CONTRAST   Ambulatory referral to Pain Clinic     Epidural Steroid Injection Patient Information  Description: The epidural space surrounds the nerves as they exit the spinal cord.  In some patients, the nerves can be compressed and inflamed by a bulging disc or a tight spinal canal (spinal stenosis).  By injecting steroids into the epidural space, we can bring irritated nerves into direct contact with a potentially helpful medication.  These steroids act directly on the irritated nerves and can reduce swelling and inflammation which often leads to decreased pain.  Epidural steroids may be injected anywhere along the spine and from the neck to the low back depending upon the location of your pain.   After numbing the skin with local anesthetic (like Novocaine), a small needle is passed into the epidural space slowly.  You may experience a sensation of pressure while this is being done.  The entire block usually last less than 10 minutes.  Conditions which may be treated by epidural steroids:  Low back and leg pain Neck and arm pain Spinal stenosis Post-laminectomy syndrome Herpes zoster (shingles) pain Pain from compression fractures  Preparation for the injection:  Do not eat any solid food or dairy products within 8 hours of your appointment.  You may drink clear liquids up  to 3 hours before appointment.  Clear liquids include water, black coffee, juice or soda.  No milk or cream please. You may take your regular medication, including pain medications, with a sip of water before your appointment  Diabetics should hold regular insulin (if taken separately) and take 1/2 normal NPH dos the morning of the procedure.  Carry some sugar containing items with you to your appointment. A driver must accompany you and be prepared to drive you home after your procedure.  Bring all your current medications with your. An IV may be inserted and sedation may be given at the discretion of the physician.   A blood pressure cuff, EKG and other monitors will often be applied during the procedure.  Some patients may need to have extra oxygen administered for a short period. You will be asked to provide medical information, including your allergies, prior to the procedure.  We must know immediately if you are taking blood thinners (like Coumadin/Warfarin)  Or if you are allergic to IV iodine contrast (dye). We must know if you could possible be pregnant.  Possible side-effects: Bleeding from needle site Infection (rare, may require surgery) Nerve injury (rare) Numbness & tingling (temporary) Difficulty urinating (rare, temporary) Spinal headache ( a headache worse with upright posture) Light -headedness (temporary) Pain at injection site (several days) Decreased blood pressure (temporary) Weakness in arm/leg (temporary) Pressure sensation in back/neck (temporary)  Call if you experience: Fever/chills associated with headache or increased back/neck pain. Headache  worsened by an upright position. New onset weakness or numbness of an extremity below the injection site Hives or difficulty breathing (go to the emergency room) Inflammation or drainage at the infection site Severe back/neck pain Any new symptoms which are concerning to you  Please note:  Although the local  anesthetic injected can often make your back or neck feel good for several hours after the injection, the pain will likely return.  It takes 3-7 days for steroids to work in the epidural space.  You may not notice any pain relief for at least that one week.  If effective, we will often do a series of three injections spaced 3-6 weeks apart to maximally decrease your pain.  After the initial series, we generally will wait several months before considering a repeat injection of the same type.  If you have any questions, please call [phone] [facility] Clinic[facility] Capsules or Tablets What is this medication? GABAPENTIN (GA ba pen tin) treats nerve pain. It may also be used to prevent and control seizures in people with epilepsy. It works by calming overactive nerves in your body. This medicine may be used for other purposes; ask your health care provider or pharmacist if you have questions. COMMON BRAND NAME(S): Active-PAC with Gabapentin, Ascencion Dike, Gralise, Neurontin What should I tell my care team before I take this medication? They need to know if you have any of these conditions: Alcohol or substance use disorder Kidney disease Lung or breathing disease Suicidal thoughts, plans, or attempt; a previous suicide attempt by you or a family member An unusual or allergic reaction to gabapentin, other medications, foods, dyes, or preservatives Pregnant or trying to get pregnant Breast-feeding How should I use this medication? Take this medication by mouth with a glass of water. Follow the directions on the prescription label. You can take it with or without food. If it upsets your stomach, take it with food. Take your medication at regular intervals. Do not take it more often than directed. Do not stop taking except on your care team's advice. If you are directed to break the 600 or 800 mg tablets in half as part of your dose, the extra half tablet should be used  for the next dose. If you have not used the extra half tablet within 28 days, it should be thrown away. A special MedGuide will be given to you by the pharmacist with each prescription and refill. Be sure to read this information carefully each time. Talk to your care team about the use of this medication in children. While this medication may be prescribed for children as young as 3 years for selected conditions, precautions do apply. Overdosage: If you think you have taken too much of this medicine contact a poison control center or emergency room at once. NOTE: This medicine is only for you. Do not share this medicine with others. What if I miss a dose? If you miss a dose, take it as soon as you can. If it is almost time for your next dose, take only that dose. Do not take double or extra doses. What may interact with this medication? Alcohol Antihistamines for allergy, cough, and cold Certain medications for anxiety or sleep Certain medications for depression like amitriptyline, fluoxetine, sertraline Certain medications for seizures like phenobarbital, primidone Certain medications for stomach problems General anesthetics like halothane, isoflurane, methoxyflurane, propofol Local anesthetics like lidocaine, pramoxine, tetracaine Medications that relax muscles for surgery Opioid medications for pain Phenothiazines like  chlorpromazine, mesoridazine, prochlorperazine, thioridazine This list may not describe all possible interactions. Give your health care provider a list of all the medicines, herbs, non-prescription drugs, or dietary supplements you use. Also tell them if you smoke, drink alcohol, or use illegal drugs. Some items may interact with your medicine. What should I watch for while using this medication? Visit your care team for regular checks on your progress. You may want to keep a record at home of how you feel your condition is responding to treatment. You may want to share this  information with your care team at each visit. You should contact your care team if your seizures get worse or if you have any new types of seizures. Do not stop taking this medication or any of your seizure medications unless instructed by your care team. Stopping your medication suddenly can increase your seizures or their severity. This medication may cause serious skin reactions. They can happen weeks to months after starting the medication. Contact your care team right away if you notice fevers or flu-like symptoms with a rash. The rash may be red or purple and then turn into blisters or peeling of the skin. Or, you might notice a red rash with swelling of the face, lips or lymph nodes in your neck or under your arms. Wear a medical identification bracelet or chain if you are taking this medication for seizures. Carry a card that lists all your medications. This medication may affect your coordination, reaction time, or judgment. Do not drive or operate machinery until you know how this medication affects you. Sit up or stand slowly to reduce the risk of dizzy or fainting spells. Drinking alcohol with this medication can increase the risk of these side effects. Your mouth may get dry. Chewing sugarless gum or sucking hard candy, and drinking plenty of water may help. Watch for new or worsening thoughts of suicide or depression. This includes sudden changes in mood, behaviors, or thoughts. These changes can happen at any time but are more common in the beginning of treatment or after a change in dose. Call your care team right away if you experience these thoughts or worsening depression. If you become pregnant while using this medication, you may enroll in the [place] American Antiepileptic Drug Pregnancy Registry by calling [phone]. This registry collects information about the safety of antiepileptic medication use during pregnancy. What side effects may I notice from receiving this  medication? Side effects that you should report to your care team as soon as possible: Allergic reactions or angioedema--skin rash, itching, hives, swelling of the face, eyes, lips, tongue, arms, or legs, trouble swallowing or breathing Rash, fever, and swollen lymph nodes Thoughts of suicide or self harm, worsening mood, feelings of depression Trouble breathing Unusual changes in mood or behavior in children after use such as difficulty concentrating, hostility, or restlessness Side effects that usually do not require medical attention (report to your care team if they continue or are bothersome): Dizziness Drowsiness Nausea Swelling of ankles, feet, or hands Vomiting This list may not describe all possible side effects. Call your doctor for medical advice about side effects. You may report side effects to FDA at 1-800-FDA-1088. Where should I keep my medication? Keep out of reach of children and pets. Store at room temperature between 15 and 30 degrees C (59 and 86 degrees F). Get rid of any unused medication after the expiration date. This medication may cause accidental overdose and death if taken by other adults, children,  or pets. To get rid of medications that are no longer needed or have expired: Take the medication to a medication take-back program. Check with your pharmacy or law enforcement to find a location. If you cannot return the medication, check the label or package insert to see if the medication should be thrown out in the garbage or flushed down the toilet. If you are not sure, ask your care team. If it is safe to put it in the trash, empty the medication out of the container. Mix the medication with cat litter, dirt, coffee grounds, or other unwanted substance. Seal the mixture in a bag or container. Put it in the trash. NOTE: This sheet is a summary. It may not cover all possible information. If you have questions about this medicine, talk to your doctor, pharmacist, or  health care provider.  2023 Elsevier/Gold Standard (2020-08-14 00:00:00)

## 2022-06-03 ENCOUNTER — Telehealth: Payer: Self-pay | Admitting: Neurology

## 2022-06-03 NOTE — Telephone Encounter (Signed)
[employer] Josem Kaufmann: [id number] exp. 06/03/22-07/02/22  LVM for patient to call me back to schedule at [facility]

## 2022-06-17 IMAGING — CR DG CHEST 2V
2 series · 2 of 2 positions shown · non-contrast
Comparison: June 26, 2021

CLINICAL DATA: Positive TB blood test.

EXAM:
CHEST - 2 VIEW

[w chest pa]
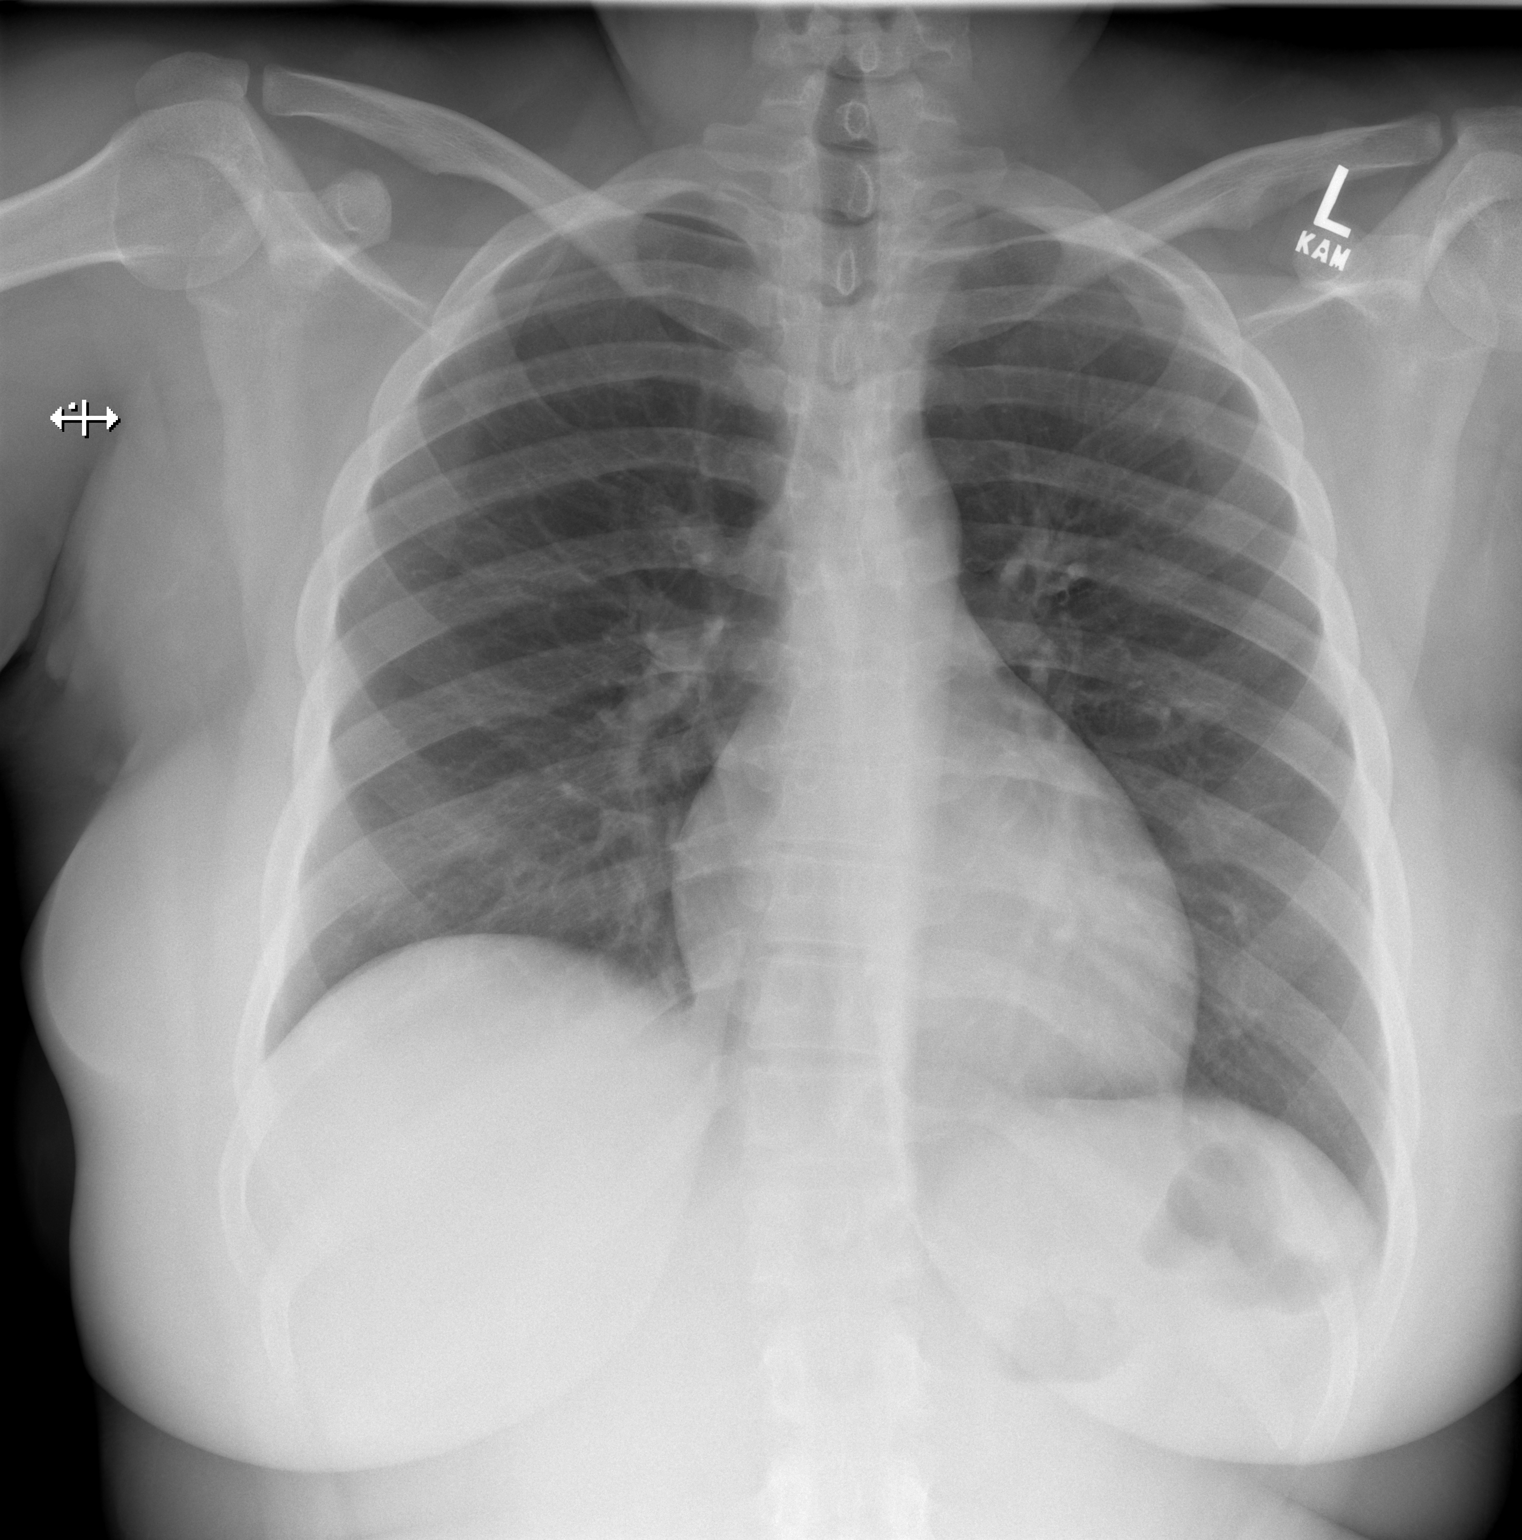

[w chest lat]
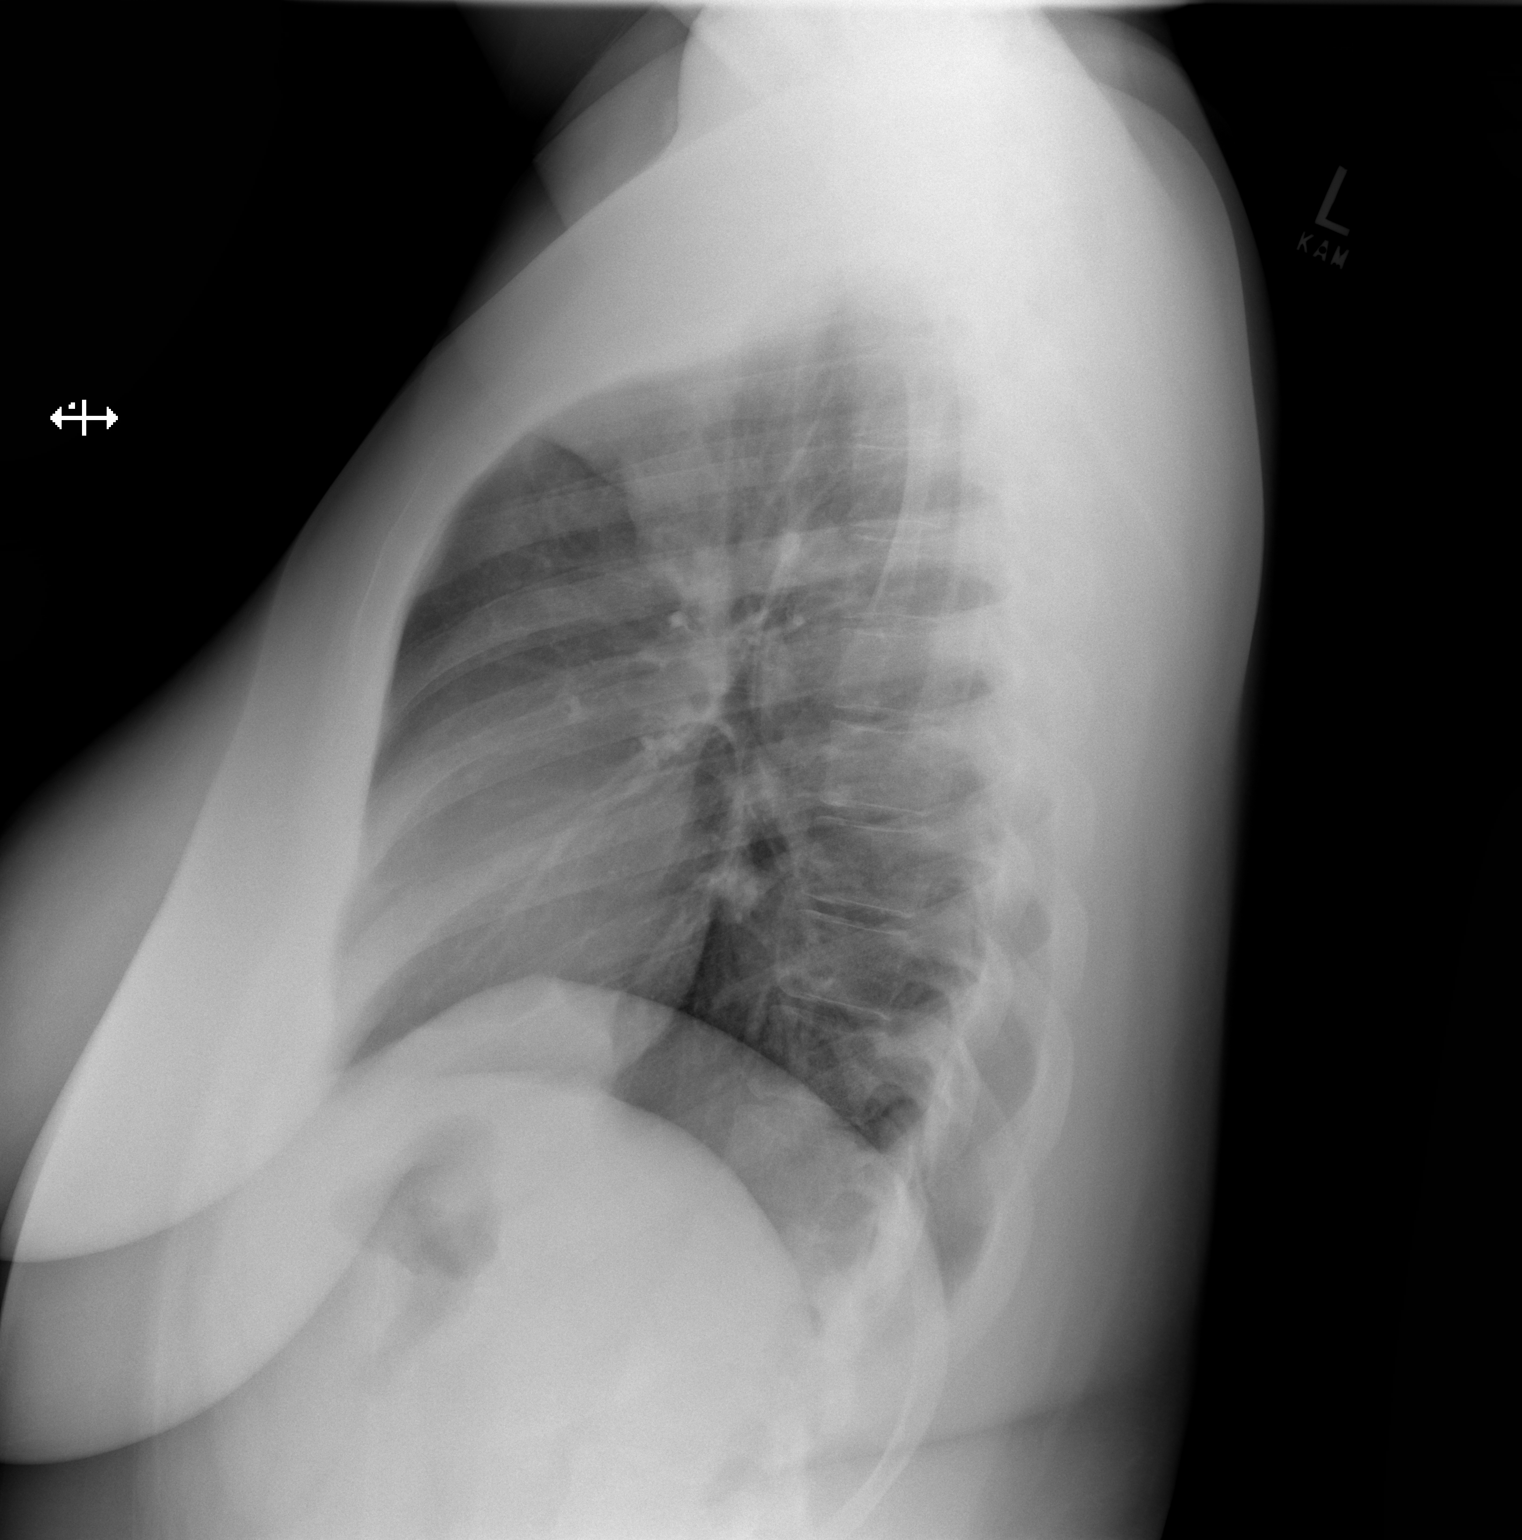

[2 of 2 positions shown; findings below may reference images not displayed]

FINDINGS: The heart size and mediastinal contours are within normal limits.
Both lungs are clear. The visualized skeletal structures are
unremarkable.
IMPRESSION: No active cardiopulmonary disease.

## 2022-11-28 ENCOUNTER — Encounter: Payer: BC Managed Care – PPO | Admitting: Nurse Practitioner

## 2023-01-08 ENCOUNTER — Encounter: Payer: BC Managed Care – PPO | Admitting: Nurse Practitioner

## 2023-06-19 ENCOUNTER — Encounter: Payer: Self-pay | Admitting: Family Medicine

## 2023-06-19 ENCOUNTER — Ambulatory Visit: Payer: BC Managed Care – PPO | Admitting: Family Medicine

## 2023-06-19 VITALS — BP 124/78 | HR 84 | Temp 97.3°F | Ht 69.0 in | Wt 252.0 lb

## 2023-06-19 DIAGNOSIS — L304 Erythema intertrigo: Secondary | ICD-10-CM | POA: Diagnosis not present

## 2023-06-19 MED ORDER — NYSTATIN-TRIAMCINOLONE 100000-0.1 UNIT/GM-% EX CREA: 1.0000 | TOPICAL_CREAM | Freq: Two times a day (BID) | CUTANEOUS | 0 refills | Status: AC

## 2023-06-19 NOTE — Progress Notes (Signed)
Subjective:     Patient ID: Rebekah Bennett, female    DOB: Jul 23, 2001, 22 y.o.   MRN: [id number]  Chief Complaint  Patient presents with   Abdominal Pain    When she was 3 had imbilical hernia surgery and hasn't had an issue since, but now getting irration now around it. Itching and scabbing for about a month. Putting tea tree oil on it which has helped redness and swelling    HPI  History of Present Illness         Swelling, redness and tenderness around her belly button x 4 weeks. Improved with tea tree oil.  She saw white substance inside her belly button yesterday.   Hx of hernia umbilical surgery at age 52.   In graduate school for mental health counseling  Working and doing an internship    Health Maintenance Due  Topic Date Due   HIV Screening  Never done   Hepatitis C Screening  Never done   INFLUENZA VACCINE  Never done    Past Medical History:  Diagnosis Date   Abdominal pain    Alpha (0) thalassemia (HCC)    Trait   Arthritis    Constipation    Hydradenitis    Postprandial bloating    Tonsillar hypertrophy 06/04/2018    Past Surgical History:  Procedure Laterality Date   AXILLARY SURGERY Left    For hydradenitis   HERNIA REPAIR     TONSILLECTOMY Bilateral 04/02/2022   Procedure: TONSILLECTOMY;  Surgeon: Laren Boom, DO;  Location: [facility] OR;  Service: ENT;  Laterality: Bilateral;   WISDOM TOOTH EXTRACTION      Family History  Adopted: Yes  Problem Relation Age of Onset   Hypertension Mother    Healthy Mother    Hypertension Maternal Grandmother    Hypertension Maternal Grandfather    Heart disease Paternal Grandmother    Diabetes Paternal Grandmother    Neuropathy Neg Hx     Social History   Socioeconomic History   Marital status: Single    Spouse name: Not on file   Number of children: Not on file   Years of education: Not on file   Highest education level: Not on file  Occupational History   Not on file  Tobacco Use   Smoking  status: Never   Smokeless tobacco: Never  Vaping Use   Vaping status: Never Used  Substance and Sexual Activity   Alcohol use: Yes    Comment: occasional   Drug use: Not Currently   Sexual activity: Yes    Birth control/protection: Pill  Other Topics Concern   Not on file  Social History Narrative   8th grade   Social Determinants of Health   Financial Resource Strain: Not on file  Food Insecurity: Not on file  Transportation Needs: Not on file  Physical Activity: Not on file  Stress: Not on file  Social Connections: Not on file  Intimate Partner Violence: Not on file    Outpatient Medications Prior to Visit  Medication Sig Dispense Refill   cyclobenzaprine (FLEXERIL) 10 MG tablet Take 10 mg by mouth 3 (three) times daily as needed for muscle spasms.     inFLIXimab (REMICADE IV) Inject 1,100 mg into the vein every 8 (eight) weeks.     lidocaine (HM LIDOCAINE PATCH) 4 % Place 1 patch onto the skin daily as needed (low back pain). 30 patch 2   Norethin Ace-Eth Estrad-FE (JUNEL FE 24 PO) Take by mouth.  sulfamethoxazole-trimethoprim (BACTRIM DS) 800-160 MG tablet Take 1 tablet by mouth 2 (two) times daily.     traMADol (ULTRAM) 50 MG tablet Take by mouth every 6 (six) hours as needed.     gabapentin (NEURONTIN) 300 MG capsule Take 1 capsule (300 mg total) by mouth 2 (two) times daily. (Patient not taking: Reported on 06/19/2023) 60 capsule 11   methocarbamol (ROBAXIN) 500 MG tablet Take 500 mg by mouth every 8 (eight) hours as needed for muscle spasms. (Patient not taking: Reported on 06/19/2023)     No facility-administered medications prior to visit.    Allergies  Allergen Reactions   Dust Mite Extract Other (See Comments)    Sneezing, congestion, runny nose   Gramineae Pollens Itching    Itchy, sneezing    Review of Systems  Constitutional:  Negative for chills and fever.  Respiratory:  Negative for shortness of breath.   Cardiovascular:  Negative for chest pain,  palpitations and leg swelling.  Gastrointestinal:  Negative for abdominal pain, constipation, diarrhea, nausea and vomiting.  Genitourinary:  Negative for dysuria, frequency and urgency.  Skin:  Positive for itching and rash.  Neurological:  Negative for dizziness and focal weakness.       Objective:    Physical Exam Constitutional:      General: She is not in acute distress.    Appearance: She is not ill-appearing.  Eyes:     Extraocular Movements: Extraocular movements intact.     Conjunctiva/sclera: Conjunctivae normal.  Cardiovascular:     Rate and Rhythm: Normal rate.  Pulmonary:     Effort: Pulmonary effort is normal.  Abdominal:     General: There is no distension.     Tenderness: There is no abdominal tenderness.  Musculoskeletal:     Cervical back: Normal range of motion and neck supple.  Skin:    General: Skin is warm and dry.     Findings: Rash present.     Comments: Umbilical area with a red, pruritic rash with dry flaky. No swelling, drainage or fluctuance.   Neurological:     General: No focal deficit present.     Mental Status: She is alert and oriented to person, place, and time.  Psychiatric:        Mood and Affect: Mood normal.        Behavior: Behavior normal.        Thought Content: Thought content normal.      BP 124/78 (BP Location: Left Arm, Patient Position: Sitting, Cuff Size: Large)   Pulse 84   Temp (!) 97.3 F (36.3 C) (Temporal)   Ht 5\' 9"  (1.753 m)   Wt 252 lb (114.3 kg)   SpO2 100%   BMI 37.21 kg/m  Wt Readings from Last 3 Encounters:  06/19/23 252 lb (114.3 kg)  06/02/22 240 lb 12.8 oz (109.2 kg)  05/22/22 243 lb 2 oz (110.3 kg)       Assessment & Plan:   Problem List Items Addressed This Visit   None Visit Diagnoses     Intertrigo    -  Primary   Relevant Medications   nystatin-triamcinolone (MYCOLOG II) cream      Discussed keeping the area clean and dry. Use a hairdryer in crevices.  Use MYcolog cream. Try cotton  material to keep skin from touching.  Follow up if not improving or if signs of secondary bacterial infection.   I have discontinued Himani E. Heavner's methocarbamol and gabapentin. I am also having her  start on nystatin-triamcinolone. Additionally, I am having her maintain her inFLIXimab (REMICADE IV), lidocaine, cyclobenzaprine, Norethin Ace-Eth Estrad-FE (JUNEL FE 24 PO), sulfamethoxazole-trimethoprim, and traMADol.  Meds ordered this encounter  Medications   nystatin-triamcinolone (MYCOLOG II) cream    Sig: Apply 1 Application topically 2 (two) times daily.    Dispense:  30 g    Refill:  0    Order Specific Question:   Supervising Provider    Answer:   Hillard Danker A [[id number]]

## 2023-06-19 NOTE — Patient Instructions (Signed)
Keep the area dry.   Use the cream for the next 2-4 weeks.   Let us know if the area is getting worse or not improving by next week.

## 2023-07-31 ENCOUNTER — Encounter: Payer: BC Managed Care – PPO | Admitting: Nurse Practitioner

## 2023-08-21 ENCOUNTER — Telehealth: Payer: Self-pay | Admitting: Nurse Practitioner

## 2023-08-21 ENCOUNTER — Encounter: Payer: Self-pay | Admitting: Nurse Practitioner

## 2023-08-21 NOTE — Telephone Encounter (Signed)
Copied from CRM [id number]. Topic: Clinical - Medication Refill >> Aug 21, 2023 11:07 AM Cassiday T wrote: Most Recent Primary Care Visit:  Provider: Avanell Shackleton  Department: [facility]  Visit Type: OFFICE VISIT  Date: 06/19/2023  Medication: sulfamethoxazole-trimethoprim (BACTRIM DS) 800-160 MG tablet [[id number]] patient other dr that prescribed this medication has not responded back to patient she is in a lot pain and would like to have this medication filled she is in pain and needs the medication   Has the patient contacted their pharmacy? Yes (Agent: If no, request that the patient contact the pharmacy for the refill. If patient does not wish to contact the pharmacy document the reason why and proceed with request.) (Agent: If yes, when and what did the pharmacy advise?)  Is this the correct pharmacy for this prescription? Yes If no, delete pharmacy and type the correct one.  This is the patient's preferred pharmacy:  [id number] [place], [place] - [address] [place] [address] Phone: [phone] Fax: [phone]   Has the prescription been filled recently? No  Is the patient out of the medication? Yes  Has the patient been seen for an appointment in the last year OR does the patient have an upcoming appointment? Yes  Can we respond through MyChart? No  Agent: Please be advised that Rx refills may take up to 3 business days. We ask that you follow-up with your pharmacy.

## 2023-10-09 ENCOUNTER — Encounter: Payer: 59 | Admitting: Nurse Practitioner
